# Patient Record
Sex: Female | Born: 1950 | Race: White | Hispanic: Yes | Smoking: Never smoker
Health system: Southern US, Community
[De-identification: ages and names within clinical notes are randomized; demographics above are authoritative.]

## PROBLEM LIST (undated history)

## (undated) DIAGNOSIS — M1990 Unspecified osteoarthritis, unspecified site: Secondary | ICD-10-CM

## (undated) DIAGNOSIS — I8390 Asymptomatic varicose veins of unspecified lower extremity: Secondary | ICD-10-CM

## (undated) DIAGNOSIS — K219 Gastro-esophageal reflux disease without esophagitis: Secondary | ICD-10-CM

## (undated) DIAGNOSIS — E039 Hypothyroidism, unspecified: Secondary | ICD-10-CM

## (undated) DIAGNOSIS — I10 Essential (primary) hypertension: Secondary | ICD-10-CM

## (undated) HISTORY — PX: JOINT REPLACEMENT: SHX530

---

## 2007-04-22 ENCOUNTER — Emergency Department (HOSPITAL_COMMUNITY): Admission: EM | Admit: 2007-04-22 | Discharge: 2007-04-22 | Payer: Self-pay | Admitting: Emergency Medicine

## 2007-05-15 ENCOUNTER — Emergency Department (HOSPITAL_COMMUNITY): Admission: EM | Admit: 2007-05-15 | Discharge: 2007-05-15 | Payer: Self-pay | Admitting: Emergency Medicine

## 2007-06-20 ENCOUNTER — Inpatient Hospital Stay (HOSPITAL_COMMUNITY): Admission: RE | Admit: 2007-06-20 | Discharge: 2007-06-22 | Payer: Self-pay | Admitting: Orthopedic Surgery

## 2007-06-30 HISTORY — PX: HIP SURGERY: SHX245

## 2010-07-15 ENCOUNTER — Emergency Department (HOSPITAL_COMMUNITY): Admission: EM | Admit: 2010-07-15 | Discharge: 2010-07-16 | Payer: Self-pay | Admitting: Emergency Medicine

## 2011-01-11 LAB — URINALYSIS, ROUTINE W REFLEX MICROSCOPIC
Bilirubin Urine: NEGATIVE
Ketones, ur: NEGATIVE mg/dL
Nitrite: NEGATIVE
Urobilinogen, UA: 1 mg/dL (ref 0.0–1.0)

## 2011-01-11 LAB — POCT I-STAT, CHEM 8
BUN: 9 mg/dL (ref 6–23)
Chloride: 105 mEq/L (ref 96–112)
Creatinine, Ser: 0.5 mg/dL (ref 0.4–1.2)
Glucose, Bld: 140 mg/dL — ABNORMAL HIGH (ref 70–99)
Sodium: 141 mEq/L (ref 135–145)
TCO2: 26 mmol/L (ref 0–100)

## 2011-01-11 LAB — CBC
HCT: 42.8 % (ref 36.0–46.0)
Hemoglobin: 14.7 g/dL (ref 12.0–15.0)
MCH: 31.2 pg (ref 26.0–34.0)
MCHC: 34.3 g/dL (ref 30.0–36.0)
MCV: 90.9 fL (ref 78.0–100.0)
RBC: 4.71 MIL/uL (ref 3.87–5.11)

## 2011-01-11 LAB — POCT CARDIAC MARKERS
CKMB, poc: <1 ng/mL — ABNORMAL LOW (ref 1.0–8.0)
Myoglobin, poc: 29.6 ng/mL (ref 12–200)
Myoglobin, poc: 34.3 ng/mL (ref 12–200)

## 2011-01-11 LAB — DIFFERENTIAL
Eosinophils Absolute: 0 10*3/uL (ref 0.0–0.7)
Eosinophils Relative: 0 % (ref 0–5)
Lymphs Abs: 1.3 10*3/uL (ref 0.7–4.0)
Monocytes Absolute: 0.4 10*3/uL (ref 0.1–1.0)
Monocytes Relative: 5 % (ref 3–12)

## 2011-03-13 NOTE — H&P (Signed)
NAME:  Shannon [name]              ACCOUNT NO.:  [id number]   MEDICAL RECORD NO.:  [id number]          PATIENT TYPE:  INP   LOCATION:  NA                           FACILITY:  [facility]   PHYSICIAN:  Dyke Brackett, M.D.    DATE OF BIRTH:  [birth year]   DATE OF ADMISSION:  06/20/2007  DATE OF DISCHARGE:                              HISTORY & PHYSICAL   CHIEF COMPLAINT:  Right hip pain for the last 6 months.   HISTORY OF PRESENT ILLNESS:  This 60 year old Hispanic female presented  to Dr. Madelon Lips with a 13-month history of sudden-onset, progressively  worsening right hip pain.  She has had no known injury or prior surgery  to the hip.  The pain is a constant pulling sensation in the right groin  and trochanteric region with radiation into the knee.  Pain increases  was moving from a sitting to a standing position, then decreases with  lying in bed on her left side.  Percocet provides moderate relief.  The  hip pops, she cannot sleep on the right side at night and the pain keeps  her up at night.  She does have a little bit of back pain, but no  paresthesias.  She has been ambulating with a cane for 6 months.  She  speaks only [place].   ALLERGIES:  NO KNOWN DRUG ALLERGIES.   CURRENT MEDICATIONS:  1. Oxycodone 7.5/325 mg one tablet p.o. q.6 h. p.r.n. for pain.  2. Pepcid 20 mg one tablet p.o. q.a.m.   PAST MEDICAL HISTORY:  Gastroesophageal reflux disease.   PAST SURGICAL HISTORY:  She denies.   SOCIAL HISTORY:  She denies any history of cigarette smoking, alcohol  use or drug use.  She is single and lives with her son and daughter-in-  Social worker.  They live in a 1-story house.  She does have 10 children.  She  currently works for [employer]; she is an Midwife.   FAMILY HISTORY:  Mother died at an known age with unknown medical  problems.  Father died at the age of 6, no major medical problems.  She has 5 brothers and 3 sisters, all alive and well.  She has 6 sons  and 4 daughter  ranging in age from 66 to 71 and they are all alive and  well.   REVIEW OF SYSTEMS:  She does have a remote history of blurred vision.  She does wear glasses.  All other systems are negative and  noncontributory.   PHYSICAL EXAMINATION:  GENERAL:  A well-developed, well-nourished  Hispanic female in no acute distress, talks easily with examiner, walks  with a significantly antalgic gait with the use of a cane.  Height 5  feet 4 inches, weight 250 pounds; BMI is 36.  VITAL SIGNS:  Temperature 98.9 degrees Fahrenheit, pulse 76,  respirations 16 and BP 132/76.  HEENT:  Normocephalic, atraumatic without frontal or maxillary sinus  tenderness to palpation.  Conjunctivae pink.  Sclerae anicteric.  PERRLA.  EOMs intact.  No visible external ear deformities.  Hearing  grossly intact.  Tympanic membranes pearly gray bilaterally with  good  light reflex.  Nose and nasal septum midline.  Nasal mucosa pink and  moist without exudates or polyps noted.  Buccal mucosa pink and moist.  She is missing multiple teeth.  Pharynx without erythema or exudates.  Tongue and uvula midline.  Tongue without fasciculations and uvula rises  equally with phonation.  NECK:  No visible masses or lesions noted.  Trachea midline.  No  palpable lymphadenopathy nor thyromegaly.  Carotids +2 bilaterally  without bruits.  Full range of motion and nontender to palpation along  the cervical spine.  CARDIOVASCULAR:  Heart rate and rhythm regular.  S1 and S2 were present  without rubs, clicks or murmurs noted.  RESPIRATORY:  Respirations even and unlabored.  Breath sounds clear to  auscultation bilaterally without rales or wheezes noted.  ABDOMEN:  Rounded abdominal contour.  Bowel sounds present x4 quadrants.  Soft, nontender to palpation without hepatosplenomegaly nor CVA  tenderness.  Femoral pulses +2 bilaterally.  BACK:  Nontender to palpation along the vertebral column.  BREASTS/GU/RECTAL/PELVIC:  These exams deferred  at this time.  MUSCULOSKELETAL:  No obvious deformities of bilateral upper extremities  with full range of motion of these extremities without pain.  Radial  pulses +2 bilaterally.  Full range of motion of her knees, ankles and  toes bilaterally.  DP and PT pulses +2.  No calf pain with palpation.  Negative Homans sign bilaterally.  Left hip has full extension and  flexion to 100 degrees with full internal and external rotation really  without pain.  There is no pain with palpation about the hip nor with  range of motion.  Right hip:  She is tender to palpation in the right  groin.  She has full extension, but flexion only to 45 degrees and that  causes severe pain.  Any attempts at internal or external rotation cause  severe pain and this is grossly limited.  NEUROLOGIC:  Alert and oriented x3.  Cranial nerves II-XII are grossly  intact.  Strength 5/5 in bilateral upper and lower extremities.  Rapid  alternating movements intact.  Deep tendon reflexes 2+ in bilateral  upper and lower extremities.  Rapid alternating movements intact.  Sensation intact to light touch.   RADIOLOGIC FINDINGS:  X-rays taken of her hip show arthritic changes of  the hip.   IMPRESSION:  1. End-stage osteoarthritis, right hip.  2. Gastroesophageal reflux disease.  3. Hispanic and speaks only [place].   PLAN:  Ms. Shannon [name] will be admitted to [facility] on June 20, 2007 by Dr. Lacretia Nicks. Frederico Hamman.  She will undergo a right hip replacement  by him.  She will undergo all the routine preoperative laboratory tests  and studies prior to this procedure.  If we have any medical issues  while she is hospitalized, we will consult the hospitalists.      Legrand Pitts Duffy, Arnetha Courser, M.D.  Electronically Signed    KED/MEDQ  D:  06/17/2007  T:  06/18/2007  Job:  [id number]

## 2011-03-13 NOTE — Op Note (Signed)
[name]              ACCOUNT NO.:  [id number]   MEDICAL RECORD NO.:  [id number]          PATIENT TYPE:  INP   LOCATION:  5029                         FACILITY:  [facility]   PHYSICIAN:  Dyke Brackett, M.D.    DATE OF BIRTH:  1951/04/12   DATE OF PROCEDURE:  06/20/2007  DATE OF DISCHARGE:                               OPERATIVE REPORT   PREOPERATIVE DIAGNOSIS:  Osteoarthritis right hip.   POSTOPERATIVE DIAGNOSIS:  Osteoarthritis right hip.   OPERATION:  Right total hip replacement (ASR metal-on-metal hip with  13.5 mg AML stem, +2 mm neck length, 46 mm hip ball and 52 mm  acetabulum).   SURGEON:  Dyke Brackett, M.D.   ASSISTANT:  Arlys John D. Petrarca, P.A.-C.   BLOOD LOSS:  Approximately 100.   DESCRIPTION OF PROCEDURE:  Sterile prep and drape in the lateral  position, posterior broach of the hip made taken down iliotibial band  external rotators.  The head was cut about one fingerbreadth above the  lesser trochanter then progressively reamed up to 13 mm to accept a 13.5  mm stem.  The acetabulum was reamed to 51 to accept a 52 mm ASR cup.   Bony surfaces were irrigated.  Trial reduction was carried out with the  femur relative to the acetabulum.  It was elected to use, based on the  patient's young age, an ASR metal-on-metal cup articulation which was  inserted with good stability noted clinically.  Again trial reduction  carried out with the trial femur, +2 mm neck length, equalized leg  lengths.  The femur was inserted followed by the head and neck, again  reduction carried out.  Copious irrigation.  Stability was excellent.  No tendency to dislocate components and all stable.  Closure was  effected with a #1 a Ethibond and 2-0 Vicryl and skin clips.  The  patient was taken to the recovery room in stable condition.      Dyke Brackett, M.D.  Electronically Signed     WDC/MEDQ  D:  06/20/2007  T:  06/21/2007  Job:  [id number]

## 2011-08-10 LAB — URINALYSIS, ROUTINE W REFLEX MICROSCOPIC
Glucose, UA: NEGATIVE
pH: 7.5

## 2011-08-10 LAB — BASIC METABOLIC PANEL
BUN: 15
CO2: 28
Calcium: 8.1 — ABNORMAL LOW
Chloride: 101
Creatinine, Ser: 0.64
GFR calc non Af Amer: >60
Glucose, Bld: 115 — ABNORMAL HIGH
Glucose, Bld: 122 — ABNORMAL HIGH
Potassium: 3.3 — ABNORMAL LOW
Sodium: 136

## 2011-08-10 LAB — COMPREHENSIVE METABOLIC PANEL
AST: 19
Albumin: 3.7
Calcium: 9.5
Chloride: 101
Creatinine, Ser: 0.64
GFR calc Af Amer: >60

## 2011-08-10 LAB — CBC
HCT: 30.5 — ABNORMAL LOW
Hemoglobin: 10.5 — ABNORMAL LOW
Hemoglobin: 10.8 — ABNORMAL LOW
MCHC: 34.6
MCHC: 34.6
MCV: 89.3
Platelets: 225
Platelets: 248
Platelets: 338
RDW: 12.7
RDW: 12.9
WBC: 7.6
WBC: 9.7

## 2011-08-10 LAB — URINE CULTURE

## 2011-08-10 LAB — DIFFERENTIAL
Eosinophils Relative: 0
Lymphocytes Relative: 20
Lymphs Abs: 1.5
Monocytes Absolute: 0.4

## 2011-08-10 LAB — CROSSMATCH: ABO/RH(D): O POS

## 2011-08-10 LAB — ABO/RH: ABO/RH(D): O POS

## 2011-08-10 LAB — URINE MICROSCOPIC-ADD ON

## 2011-12-17 ENCOUNTER — Ambulatory Visit (HOSPITAL_COMMUNITY)
Admission: RE | Admit: 2011-12-17 | Discharge: 2011-12-17 | Disposition: A | Discharge status: Home / Self Care | Payer: Self-pay | Source: Ambulatory Visit | Attending: Internal Medicine | Admitting: Internal Medicine

## 2011-12-17 ENCOUNTER — Other Ambulatory Visit: Payer: Self-pay

## 2011-12-17 DIAGNOSIS — Z01810 Encounter for preprocedural cardiovascular examination: Secondary | ICD-10-CM | POA: Insufficient documentation

## 2011-12-17 DIAGNOSIS — R9431 Abnormal electrocardiogram [ECG] [EKG]: Secondary | ICD-10-CM | POA: Insufficient documentation

## 2011-12-17 DIAGNOSIS — Z01818 Encounter for other preprocedural examination: Secondary | ICD-10-CM

## 2012-02-29 ENCOUNTER — Emergency Department (HOSPITAL_COMMUNITY)
Admission: EM | Admit: 2012-02-29 | Discharge: 2012-02-29 | Disposition: A | Discharge status: Home / Self Care | Payer: Self-pay | Attending: Emergency Medicine | Admitting: Emergency Medicine

## 2012-02-29 ENCOUNTER — Encounter (HOSPITAL_COMMUNITY): Payer: Self-pay | Admitting: Emergency Medicine

## 2012-02-29 DIAGNOSIS — Z711 Person with feared health complaint in whom no diagnosis is made: Secondary | ICD-10-CM | POA: Insufficient documentation

## 2012-02-29 DIAGNOSIS — M79609 Pain in unspecified limb: Secondary | ICD-10-CM | POA: Insufficient documentation

## 2012-02-29 DIAGNOSIS — M549 Dorsalgia, unspecified: Secondary | ICD-10-CM | POA: Insufficient documentation

## 2012-02-29 NOTE — ED Provider Notes (Signed)
History   This chart was scribed for Nelia Shi, MD by Brooks Sailors. The patient was seen in room STRE8/STRE8. Patient's care was started at 1149.   CSN: [id number]  Arrival date & time 02/29/12  1149   First MD Initiated Contact with Patient 02/29/12 1425      Chief Complaint  Patient presents with  . Back Pain  . Leg Pain    HPI Shannon [name] is a 61 y.o. female who presents to the Emergency Department for a follow up appointment. Patient does not speak english and was accompanied by nephew who spoke with physician. Patient was told to come to hospital for pre-surgery work but did not bring paperwork and was brought to ER by Nephew who was unsure where to bring patient. Nephew said that she has relative who could not come today but knows more about patients medical condition and will accompany patient on future visit.    History reviewed. No pertinent past medical history.  History reviewed. No pertinent past surgical history.  History reviewed. No pertinent family history.  History  Substance Use Topics  . Smoking status: Never Smoker   . Smokeless tobacco: Not on file  . Alcohol Use: No    OB History    Grav Para Term Preterm Abortions TAB SAB Ect Mult Living                  Review of Systems  All other systems reviewed and are negative.    Allergies  Review of patient's allergies indicates no known allergies.  Home Medications   Current Outpatient Rx  Name Route Sig Dispense Refill  . OMEGA-3 FATTY ACIDS 1000 MG PO CAPS Oral Take 2 g by mouth daily.    . OXYCODONE HCL 5 MG PO CAPS Oral Take 5 mg by mouth every 4 (four) hours as needed. For pain    . PRESCRIPTION MEDICATION Oral Take 1 tablet by mouth daily. Blood pressure med. Unsure of name. Called the pharmacy pt goes to, they do not even have the pt listed on file      BP 143/56  Pulse 82  Temp(Src) 98.2 F (36.8 C) (Oral)  Resp 20  SpO2 95%  Physical Exam  Nursing note and vitals  reviewed. Constitutional: She is oriented to person, place, and time. She appears well-developed and well-nourished. No distress.  HENT:  Head: Normocephalic and atraumatic.  Eyes: EOM are normal.  Neck: Neck supple. No tracheal deviation present.  Cardiovascular: Normal rate.   Pulmonary/Chest: Effort normal. No respiratory distress.  Musculoskeletal: Normal range of motion.  Neurological: She is alert and oriented to person, place, and time.  Skin: Skin is warm and dry.  Psychiatric: She has a normal mood and affect. Her behavior is normal.    ED Course  Procedures (including critical care time) DIAGNOSTIC STUDIES: Oxygen Saturation is 95% on room air, adequate by my interpretation.    COORDINATION OF CARE: 2:15PM Patient to come back to hospital with relative soon who knows were to take patient for evaluation for future surgery.    Labs Reviewed - No data to display No results found.   1. Person with feared complaint in whom no diagnosis is made       MDM  I personally performed the services described in this documentation, which was scribed in my presence. The recorded information has been reviewed and considered.     Nelia Shi, MD 03/02/12 1136

## 2012-02-29 NOTE — ED Notes (Signed)
Pt c/o left lower back pain into left leg x several weeks; here to get eval; pt speaks little english but family at bedside speaks well

## 2012-03-03 ENCOUNTER — Other Ambulatory Visit (HOSPITAL_COMMUNITY): Payer: Self-pay | Admitting: Internal Medicine

## 2012-03-03 ENCOUNTER — Ambulatory Visit (HOSPITAL_COMMUNITY)
Admission: RE | Admit: 2012-03-03 | Discharge: 2012-03-03 | Disposition: A | Discharge status: Home / Self Care | Payer: Self-pay | Source: Ambulatory Visit | Attending: Internal Medicine | Admitting: Internal Medicine

## 2012-03-03 DIAGNOSIS — E049 Nontoxic goiter, unspecified: Secondary | ICD-10-CM | POA: Insufficient documentation

## 2012-03-03 DIAGNOSIS — I517 Cardiomegaly: Secondary | ICD-10-CM | POA: Insufficient documentation

## 2012-03-03 DIAGNOSIS — R0602 Shortness of breath: Secondary | ICD-10-CM | POA: Insufficient documentation

## 2012-03-03 DIAGNOSIS — Z01818 Encounter for other preprocedural examination: Secondary | ICD-10-CM

## 2012-09-01 ENCOUNTER — Other Ambulatory Visit: Payer: Self-pay

## 2012-09-01 ENCOUNTER — Emergency Department (HOSPITAL_COMMUNITY): Payer: Self-pay

## 2012-09-01 ENCOUNTER — Encounter (HOSPITAL_COMMUNITY): Payer: Self-pay | Admitting: *Deleted

## 2012-09-01 ENCOUNTER — Inpatient Hospital Stay (HOSPITAL_COMMUNITY)
Admission: EM | Admit: 2012-09-01 | Discharge: 2012-09-07 | DRG: 603 | Disposition: A | Discharge status: Home / Self Care | Payer: Self-pay | Attending: Internal Medicine | Admitting: Internal Medicine

## 2012-09-01 DIAGNOSIS — R600 Localized edema: Secondary | ICD-10-CM

## 2012-09-01 DIAGNOSIS — E876 Hypokalemia: Secondary | ICD-10-CM | POA: Diagnosis present

## 2012-09-01 DIAGNOSIS — I8390 Asymptomatic varicose veins of unspecified lower extremity: Secondary | ICD-10-CM | POA: Diagnosis present

## 2012-09-01 DIAGNOSIS — E049 Nontoxic goiter, unspecified: Secondary | ICD-10-CM | POA: Diagnosis present

## 2012-09-01 DIAGNOSIS — M1990 Unspecified osteoarthritis, unspecified site: Secondary | ICD-10-CM | POA: Diagnosis present

## 2012-09-01 DIAGNOSIS — L03119 Cellulitis of unspecified part of limb: Principal | ICD-10-CM

## 2012-09-01 DIAGNOSIS — E010 Iodine-deficiency related diffuse (endemic) goiter: Secondary | ICD-10-CM | POA: Diagnosis present

## 2012-09-01 DIAGNOSIS — K2980 Duodenitis without bleeding: Secondary | ICD-10-CM | POA: Diagnosis present

## 2012-09-01 DIAGNOSIS — Z6841 Body Mass Index (BMI) 40.0 and over, adult: Secondary | ICD-10-CM

## 2012-09-01 DIAGNOSIS — K219 Gastro-esophageal reflux disease without esophagitis: Secondary | ICD-10-CM | POA: Diagnosis present

## 2012-09-01 DIAGNOSIS — R609 Edema, unspecified: Secondary | ICD-10-CM | POA: Diagnosis present

## 2012-09-01 DIAGNOSIS — R140 Abdominal distension (gaseous): Secondary | ICD-10-CM | POA: Diagnosis present

## 2012-09-01 DIAGNOSIS — L02419 Cutaneous abscess of limb, unspecified: Principal | ICD-10-CM | POA: Diagnosis present

## 2012-09-01 DIAGNOSIS — I872 Venous insufficiency (chronic) (peripheral): Secondary | ICD-10-CM | POA: Diagnosis present

## 2012-09-01 DIAGNOSIS — I10 Essential (primary) hypertension: Secondary | ICD-10-CM | POA: Diagnosis present

## 2012-09-01 DIAGNOSIS — R7989 Other specified abnormal findings of blood chemistry: Secondary | ICD-10-CM

## 2012-09-01 HISTORY — DX: Unspecified osteoarthritis, unspecified site: M19.90

## 2012-09-01 HISTORY — DX: Asymptomatic varicose veins of unspecified lower extremity: I83.90

## 2012-09-01 HISTORY — DX: Gastro-esophageal reflux disease without esophagitis: K21.9

## 2012-09-01 HISTORY — DX: Hypothyroidism, unspecified: E03.9

## 2012-09-01 HISTORY — DX: Essential (primary) hypertension: I10

## 2012-09-01 LAB — CBC WITH DIFFERENTIAL/PLATELET
HCT: 40.2 % (ref 36.0–46.0)
Hemoglobin: 13.2 g/dL (ref 12.0–15.0)
Lymphs Abs: 1.6 10*3/uL (ref 0.7–4.0)
MCH: 29.9 pg (ref 26.0–34.0)
Monocytes Absolute: 0.7 10*3/uL (ref 0.1–1.0)
Monocytes Relative: 11 % (ref 3–12)
Neutro Abs: 3.9 10*3/uL (ref 1.7–7.7)
Neutrophils Relative %: 61 % (ref 43–77)
RBC: 4.41 MIL/uL (ref 3.87–5.11)

## 2012-09-01 NOTE — ED Notes (Signed)
Pt reports bilat leg pain, swelling and redness x2 days - pt w/ bilat +3 pitting edema to bilat LE, varicose veins noted bilat, and erythema. Pt admits to some shortness of breath and back pain as well. Lung sounds clear and equal bilat. Pt admits to fever x2 days ago, however family states pt takes ibuprofen daily. Pt A&Ox4, in no acute distress.

## 2012-09-01 NOTE — ED Notes (Signed)
Pt in restroom at this time.

## 2012-09-02 ENCOUNTER — Emergency Department (HOSPITAL_COMMUNITY): Payer: Self-pay

## 2012-09-02 ENCOUNTER — Encounter (HOSPITAL_COMMUNITY): Payer: Self-pay | Admitting: Internal Medicine

## 2012-09-02 DIAGNOSIS — R609 Edema, unspecified: Secondary | ICD-10-CM

## 2012-09-02 DIAGNOSIS — M7989 Other specified soft tissue disorders: Secondary | ICD-10-CM

## 2012-09-02 DIAGNOSIS — R7989 Other specified abnormal findings of blood chemistry: Secondary | ICD-10-CM

## 2012-09-02 DIAGNOSIS — R791 Abnormal coagulation profile: Secondary | ICD-10-CM

## 2012-09-02 DIAGNOSIS — I10 Essential (primary) hypertension: Secondary | ICD-10-CM | POA: Diagnosis present

## 2012-09-02 DIAGNOSIS — K219 Gastro-esophageal reflux disease without esophagitis: Secondary | ICD-10-CM | POA: Diagnosis present

## 2012-09-02 DIAGNOSIS — M1990 Unspecified osteoarthritis, unspecified site: Secondary | ICD-10-CM | POA: Diagnosis present

## 2012-09-02 DIAGNOSIS — R600 Localized edema: Secondary | ICD-10-CM

## 2012-09-02 DIAGNOSIS — L03119 Cellulitis of unspecified part of limb: Secondary | ICD-10-CM

## 2012-09-02 DIAGNOSIS — I8390 Asymptomatic varicose veins of unspecified lower extremity: Secondary | ICD-10-CM | POA: Diagnosis present

## 2012-09-02 LAB — COMPREHENSIVE METABOLIC PANEL
Alkaline Phosphatase: 53 U/L (ref 39–117)
BUN: 9 mg/dL (ref 6–23)
Chloride: 98 mEq/L (ref 96–112)
Creatinine, Ser: 0.54 mg/dL (ref 0.50–1.10)
GFR calc Af Amer: >90 mL/min (ref >90)
GFR calc non Af Amer: >90 mL/min (ref >90)
Glucose, Bld: 103 mg/dL — ABNORMAL HIGH (ref 70–99)
Potassium: 3.2 mEq/L — ABNORMAL LOW (ref 3.5–5.1)
Total Bilirubin: 0.3 mg/dL (ref 0.3–1.2)

## 2012-09-02 LAB — PRO B NATRIURETIC PEPTIDE: Pro B Natriuretic peptide (BNP): 49.8 pg/mL (ref 0–125)

## 2012-09-02 MED ORDER — ACETAMINOPHEN 325 MG PO TABS
650.0000 mg | ORAL_TABLET | Freq: Four times a day (QID) | ORAL | Status: DC | PRN
  Administered 2012-09-03 – 2012-09-04 (×2): 650 mg via ORAL
  Filled 2012-09-02 (×2): qty 2

## 2012-09-02 MED ORDER — PANTOPRAZOLE SODIUM 40 MG PO TBEC
40.0000 mg | DELAYED_RELEASE_TABLET | Freq: Every day | ORAL | Status: DC
  Administered 2012-09-02 – 2012-09-04 (×3): 40 mg via ORAL
  Filled 2012-09-02 (×3): qty 1

## 2012-09-02 MED ORDER — NAPROXEN 500 MG PO TABS
500.0000 mg | ORAL_TABLET | Freq: Two times a day (BID) | ORAL | Status: DC
  Administered 2012-09-02 – 2012-09-05 (×5): 500 mg via ORAL
  Filled 2012-09-02 (×9): qty 1

## 2012-09-02 MED ORDER — METOPROLOL TARTRATE 50 MG PO TABS
50.0000 mg | ORAL_TABLET | Freq: Two times a day (BID) | ORAL | Status: DC
  Administered 2012-09-02 – 2012-09-06 (×7): 50 mg via ORAL
  Filled 2012-09-02 (×10): qty 1

## 2012-09-02 MED ORDER — OXYCODONE HCL 5 MG PO TABS
5.0000 mg | ORAL_TABLET | ORAL | Status: DC | PRN
  Administered 2012-09-02 – 2012-09-07 (×11): 5 mg via ORAL
  Filled 2012-09-02 (×10): qty 1

## 2012-09-02 MED ORDER — ENOXAPARIN SODIUM 60 MG/0.6ML ~~LOC~~ SOLN
60.0000 mg | SUBCUTANEOUS | Status: DC
  Administered 2012-09-03 – 2012-09-07 (×5): 60 mg via SUBCUTANEOUS
  Filled 2012-09-02 (×6): qty 0.6

## 2012-09-02 MED ORDER — ONDANSETRON HCL 4 MG/2ML IJ SOLN
4.0000 mg | Freq: Four times a day (QID) | INTRAMUSCULAR | Status: DC | PRN
  Administered 2012-09-05 – 2012-09-07 (×3): 4 mg via INTRAVENOUS
  Filled 2012-09-02 (×2): qty 2

## 2012-09-02 MED ORDER — ONDANSETRON HCL 4 MG PO TABS: 4.0000 mg | ORAL_TABLET | Freq: Four times a day (QID) | ORAL | Status: DC | PRN

## 2012-09-02 MED ORDER — ENOXAPARIN SODIUM 120 MG/0.8ML ~~LOC~~ SOLN
120.0000 mg | Freq: Once | SUBCUTANEOUS | Status: AC
  Administered 2012-09-02: 120 mg via SUBCUTANEOUS
  Filled 2012-09-02: qty 0.8

## 2012-09-02 MED ORDER — DIPHENHYDRAMINE HCL 25 MG PO CAPS
25.0000 mg | ORAL_CAPSULE | Freq: Four times a day (QID) | ORAL | Status: DC | PRN
  Administered 2012-09-02: 25 mg via ORAL
  Filled 2012-09-02: qty 1

## 2012-09-02 MED ORDER — ACETAMINOPHEN 650 MG RE SUPP: 650.0000 mg | Freq: Four times a day (QID) | RECTAL | Status: DC | PRN

## 2012-09-02 MED ORDER — CLINDAMYCIN PHOSPHATE 900 MG/50ML IV SOLN
900.0000 mg | Freq: Three times a day (TID) | INTRAVENOUS | Status: DC
  Administered 2012-09-02 – 2012-09-06 (×12): 900 mg via INTRAVENOUS
  Filled 2012-09-02 (×14): qty 50

## 2012-09-02 MED ORDER — AMLODIPINE BESYLATE 10 MG PO TABS
10.0000 mg | ORAL_TABLET | Freq: Every day | ORAL | Status: DC
  Administered 2012-09-02 – 2012-09-03 (×2): 10 mg via ORAL
  Filled 2012-09-02 (×2): qty 1

## 2012-09-02 MED ORDER — IOHEXOL 350 MG/ML SOLN
100.0000 mL | Freq: Once | INTRAVENOUS | Status: AC | PRN
  Administered 2012-09-02: 100 mL via INTRAVENOUS

## 2012-09-02 MED ORDER — ENOXAPARIN SODIUM 40 MG/0.4ML ~~LOC~~ SOLN
40.0000 mg | SUBCUTANEOUS | Status: DC
  Filled 2012-09-02: qty 0.4

## 2012-09-02 MED ORDER — CLINDAMYCIN PHOSPHATE 900 MG/50ML IV SOLN
900.0000 mg | Freq: Once | INTRAVENOUS | Status: AC
  Administered 2012-09-02: 900 mg via INTRAVENOUS
  Filled 2012-09-02: qty 50

## 2012-09-02 MED ORDER — SENNOSIDES-DOCUSATE SODIUM 8.6-50 MG PO TABS
1.0000 | ORAL_TABLET | Freq: Every evening | ORAL | Status: DC | PRN
  Administered 2012-09-03: 1 via ORAL
  Filled 2012-09-02: qty 1

## 2012-09-02 NOTE — ED Notes (Addendum)
Family at bedside. Pt speaks minimal english, niece at bedside interperets

## 2012-09-02 NOTE — H&P (Signed)
[facility] History and Physical  Shannon [name] YNW:[id number] DOB: 1951/10/22 DOA: 09/01/2012  Referring physician: Azalia Bilis PCP: [name], MD   Chief Complaint: Lower extremity redness and swelling  HPI:   History obtained via phone interpreter from patient and her daughter.   The patient is a 61 year old female with history of hypothyroidism per chart but not per patient history, hypertension, GERD, osteoarthritis and varicose veins who presented with a 2 day history of bilateral lower extremity pain and redness.  She also had a fever two days ago to 100.70F.  She states that her legs have been gradually becoming more swollen, but the pain and redness only started recently.  She denies DOE, orthopnea, PND.  She denies urinary symptoms as below, however, her urine is dark in the urine specimen cup.  She has had nausea for 2-3 days.    Review of Systems:  Patient denies chill, changes in hearing and vision, sore throat, rhinorrhea, cough, chest pain, shortness of breath, vomiting, diarrhea, abdominal pain, dysuria, hematuria, blood in stools, lymphadenopathy, abnormal bruising or bleeding, joint swelling or pains that are different from prior, focal numbness or weakness, polyuria, polydipsia.    Past Medical History  Diagnosis Date  . Hypothyroidism   . Hypertension   . Varicose vein of leg   . GERD (gastroesophageal reflux disease)   . Osteoarthritis    Past Surgical History  Procedure Date  . Hip surgery 2008-9    right   Social History:  reports that she has never smoked. She does not have any smokeless tobacco history on file. She reports that she does not drink alcohol or use illicit drugs. Ambulates with walker at home, but has had increased difficulty for the last few days.    No Known Allergies  Family History  Problem Relation Age of Onset  . High blood pressure    Denies family history of lupus.     Prior to Admission medications     Medication Sig Start Date End Date Taking? Authorizing Provider  amLODipine (NORVASC) 10 MG tablet Take 10 mg by mouth daily.   Yes Historical Provider, MD  diphenhydrAMINE (BENADRYL) 25 mg capsule Take 25 mg by mouth every 6 (six) hours as needed. Itching   Yes Historical Provider, MD  metoprolol (LOPRESSOR) 50 MG tablet Take 50 mg by mouth 2 (two) times daily.   Yes Historical Provider, MD  naproxen (NAPROSYN) 500 MG tablet Take 500 mg by mouth 2 (two) times daily with a meal.   Yes Historical Provider, MD  omeprazole (PRILOSEC) 20 MG capsule Take 20 mg by mouth daily.   Yes Historical Provider, MD  oxycodone (OXY-IR) 5 MG capsule Take 5 mg by mouth every 4 (four) hours as needed. For pain   Yes Historical Provider, MD   Physical Exam: Filed Vitals:   09/01/12 2307 09/02/12 0051 09/02/12 0320 09/02/12 0733  BP: 138/62 154/85  127/71  Pulse: 90 82  95  Temp: 98.2 F (36.8 C) 98.9 F (37.2 C)  98.4 F (36.9 C)  TempSrc: Oral Oral  Oral  Resp: 20   23  Height:   5\' 4"  (1.626 m)   Weight:   123.424 kg (272 lb 1.6 oz)   SpO2: 95% 97%  94%     General:   Hispanic obese female, no acute distress, lying on stretcher, pleasant and conversant  Eyes:  PERRL, anicteric, noninjected  ENT: MMM, no erythema or exudates or plaques  Neck: supple, my thyromegaly.  End-expiratory  wheeze from neck area.  No stridor  Cardiovascular: RRR, no m/r/g.    Respiratory: CTAB  Abdomen: NABS, soft, nondistended, obese, nontender  Skin:  Erythema of the skin around the neck and bilateral lower extremities that is mildly tender to palpation.  Large varicose veins bilaterally.  Erythema extends from 2-3" below the knees bilaterally to the ankle area without clear demarkation.    Musculoskeletal: 2+ pitting edema of lower extremities   Psychiatric: A&Ox4  Neurologic: III-XII intact, strength 5/5 throughout, sensation intact to light touch.    Labs on Admission:  Basic Metabolic Panel:  Lab  09/01/12 2330  NA 136  K 3.2*  CL 98  CO2 27  GLUCOSE 103*  BUN 9  CREATININE 0.54  CALCIUM 8.9  MG --  PHOS --   Liver Function Tests:  Lab 09/01/12 2330  AST 37  ALT 41*  ALKPHOS 53  BILITOT 0.3  PROT 6.3  ALBUMIN 3.3*   No results found for this basename: LIPASE:5,AMYLASE:5 in the last 168 hours No results found for this basename: AMMONIA:5 in the last 168 hours CBC:  Lab 09/01/12 2330  WBC 6.5  NEUTROABS 3.9  HGB 13.2  HCT 40.2  MCV 91.2  PLT 296   Cardiac Enzymes:  Lab 09/02/12 0248  CKTOTAL --  CKMB --  CKMBINDEX --  TROPONINI <0.30    BNP (last 3 results)  Basename 09/01/12 2330  PROBNP 49.8   CBG: No results found for this basename: GLUCAP:5 in the last 168 hours  Radiological Exams on Admission: Dg Chest 2 View  09/01/2012  *RADIOLOGY REPORT*  Clinical Data: Shortness of breath, wheezing and leg swelling.  CHEST - 2 VIEW  Comparison: Chest radiograph performed 03/03/2012  Findings: The lungs are well-aerated.  Vascular congestion is noted, with increased interstitial markings, compatible with pulmonary edema.  Small bilateral pleural effusions are seen, with associated lateral pleural thickening.  There is no evidence of pneumothorax.  The heart is mildly enlarged.  The patient's known chronic thyromegaly is grossly unchanged in appearance; there is associated narrowing of the subglottic trachea.  No acute osseous abnormalities are seen.  IMPRESSION:  1.  Vascular congestion, with increased interstitial markings and small bilateral pleural effusions, compatible with pulmonary edema. Mild cardiomegaly noted. 2.  Known chronic thyromegaly, grossly unchanged in appearance. Associated narrowing of the subglottic trachea.   Original Report Authenticated By: Tonia Ghent, M.D.    Ct Angio Chest Pe W/cm &/or Wo Cm  09/02/2012  *RADIOLOGY REPORT*  Clinical Data: Shortness of breath; bilateral lower extremity pain and swelling.  Elevated D-dimer.  Back pain.   CT ANGIOGRAPHY CHEST  Technique:  Multidetector CT imaging of the chest using the standard protocol during bolus administration of intravenous contrast. Multiplanar reconstructed images including MIPs were obtained and reviewed to evaluate the vascular anatomy.  Contrast: OMNIPAQUE IOHEXOL 350 MG/ML SOLN  Comparison: Chest radiograph performed 09/01/2012, and CTA of the chest performed 07/16/2010  Findings: There is no evidence of pulmonary embolus.  Mild diffuse interstitial prominence is noted, likely reflecting mild interstitial edema given the appearance on the prior chest radiograph.  No definite pleural effusion or pneumothorax is seen. No masses are identified; no abnormal focal contrast enhancement is seen.  The mediastinum is grossly unremarkable in appearance.  There is no evidence of mediastinal lymphadenopathy.  No pericardial effusion is identified.  The great vessels are unremarkable in appearance. Incidental note is again made of an enlarged thyroid gland, with narrowing of the subglottic  trachea.  No axillary lymphadenopathy is seen.  The visualized portions of the thyroid gland are unremarkable in appearance.  The visualized portions of the liver and spleen are unremarkable. A small to moderate hiatal hernia is noted.  No acute osseous abnormalities are seen.  IMPRESSION:  1.  No evidence of pulmonary embolus. 2.  Mild diffuse interstitial prominence noted, likely reflecting mild interstitial edema given the appearance on the prior chest radiograph. 3.  Enlarged thyroid gland, with associated narrowing of the subglottic trachea; this is grossly stable from 2011. 4.  Small to moderate hiatal hernia seen.   Original Report Authenticated By: Tonia Ghent, M.D.     EKG: Independently reviewed. NSR, inverted T-waves in V4-V6  Assessment/Plan Principal Problem:  *Cellulitis of leg Active Problems:  Hypertension  Varicose vein of leg  GERD (gastroesophageal reflux disease)   Osteoarthritis  Bilateral lower extremity edema  Elevated d-dimer  Lower extremity erythema:  Differential includes bilateral DVT, cellulitis, vasculitis, vitamin deficiency.   Most likely patient has venous stasis with some cellulitis.  Vasculitis less likely with normal ESR.   -  Bilateral lower extremity duplex -  Clindamycin  -  Monitor clinically for improvement.   -  Pain control with tylenol and oxycodone (home med)  Lower extremity edema:  DDx includes CHF, venous insufficiency, nephrotic syndrome, malabsorption.  Creatinine is normal so CKD less likely.   Albumin is mildly depressed  -  ECHO -  UA  -  Duplex as above  Enlarged thyroid, stable.   -  TSH -  ENT consult as outpatient  HTN, blood pressure normal.  Continue home medications GERD, stable.  Continue PPI OA, stable.  Continue oxycodone. Rolling walker to bedside  DIET: Healthy heart ACCESS: PIV IVF:  None PROPH:  lovenox   Code Status: Full code Family Communication: Spoke with patient and daughter, claudia barrientos who was present at bedside.  Debarah Crape is primary contact at [phone].  Used spanish interpreter Disposition Plan: Pending improvement in lower extremity erythema  Time spent: 45  Shaquala Broeker [facility] Pager [phone]  If 7PM-7AM, please contact night-coverage [email or web address] Password [email or web address] 09/02/2012, 8:08 AM

## 2012-09-02 NOTE — ED Notes (Signed)
Care of pt assumed. Daughter and Hospitalist MD at bedside. Using interpreter phone for assessment. Pt in NAD. Will continue to monitor. Awaiting bed assignment.

## 2012-09-02 NOTE — Progress Notes (Signed)
*  PRELIMINARY RESULTS* Vascular Ultrasound Lower extremity venous duplex has been completed.  Preliminary findings: Bilateral:  No evidence of DVT, superficial thrombosis, or Baker's Cyst.    Farrel Demark, RDMS, RVT 09/02/2012, 9:32 AM

## 2012-09-02 NOTE — ED Provider Notes (Signed)
History     CSN: [id number]  Arrival date & time 09/01/12  2132   First MD Initiated Contact with Patient 09/02/12 0108      Chief Complaint  Patient presents with  . Leg Swelling    (Consider location/radiation/quality/duration/timing/severity/associated sxs/prior treatment) HPI History provided by pt and her niece who is interpreting.  Pt c/o 2 weeks of bilateral lower leg pain.  Pain worst lateral aspect left lower leg.  2 days ago, pain became associated w/ edema and erythema of lower legs, as well as fever, max temp 103.  Unable to bear weight d/t severity.  Has never had these sx in the past.  Has chronic low back as well as chronic L hip pain.  Patient thinks the pain may be related.  She has also been short of breath since this evening and has diffuse upper back pain.  No h/o CHF.   Past Medical History  Diagnosis Date  . Hypothyroidism   . Hypertension   . Varicose vein of leg   . GERD (gastroesophageal reflux disease)     Past Surgical History  Procedure Date  . Hip surgery     History reviewed. No pertinent family history.  History  Substance Use Topics  . Smoking status: Never Smoker   . Smokeless tobacco: Not on file  . Alcohol Use: No    OB History    Grav Para Term Preterm Abortions TAB SAB Ect Mult Living                  Review of Systems  All other systems reviewed and are negative.    Allergies  Review of patient's allergies indicates no known allergies.  Home Medications   Current Outpatient Rx  Name  Route  Sig  Dispense  Refill  . AMLODIPINE BESYLATE 10 MG PO TABS   Oral   Take 10 mg by mouth daily.         Marland Kitchen DIPHENHYDRAMINE HCL 25 MG PO CAPS   Oral   Take 25 mg by mouth every 6 (six) hours as needed. Itching         . METOPROLOL TARTRATE 50 MG PO TABS   Oral   Take 50 mg by mouth 2 (two) times daily.         Marland Kitchen NAPROXEN 500 MG PO TABS   Oral   Take 500 mg by mouth 2 (two) times daily with a meal.         .  OMEPRAZOLE 20 MG PO CPDR   Oral   Take 20 mg by mouth daily.         . OXYCODONE HCL 5 MG PO CAPS   Oral   Take 5 mg by mouth every 4 (four) hours as needed. For pain           BP 154/85  Pulse 82  Temp 98.9 F (37.2 C) (Oral)  Resp 20  SpO2 97%  Physical Exam  Nursing note and vitals reviewed. Constitutional: She is oriented to person, place, and time. She appears well-developed and well-nourished. No distress.  HENT:  Head: Normocephalic and atraumatic.  Eyes:       Normal appearance  Neck: Normal range of motion.  Cardiovascular: Normal rate and regular rhythm.   Pulmonary/Chest: Effort normal and breath sounds normal. No respiratory distress.  Musculoskeletal: Normal range of motion.       2+ pittting edema bilateral lower legs to level of knee.  Varicose veins. Circumferential,  non-blanching erythema.  Severe tenderness lateral aspect left lower leg. Tenderness lateral and posterior L hip as well. 2+ DP pulse and distal sensation intact.    Neurological: She is alert and oriented to person, place, and time.  Skin: Skin is warm and dry. No rash noted.  Psychiatric: She has a normal mood and affect. Her behavior is normal.    ED Course  Procedures (including critical care time)  Labs Reviewed  COMPREHENSIVE METABOLIC PANEL - Abnormal; Notable for the following:    Potassium 3.2 (*)     Glucose, Bld 103 (*)     Albumin 3.3 (*)     ALT 41 (*)     All other components within normal limits  CBC WITH DIFFERENTIAL  PRO B NATRIURETIC PEPTIDE   Dg Chest 2 View  09/01/2012  *RADIOLOGY REPORT*  Clinical Data: Shortness of breath, wheezing and leg swelling.  CHEST - 2 VIEW  Comparison: Chest radiograph performed 03/03/2012  Findings: The lungs are well-aerated.  Vascular congestion is noted, with increased interstitial markings, compatible with pulmonary edema.  Small bilateral pleural effusions are seen, with associated lateral pleural thickening.  There is no evidence of  pneumothorax.  The heart is mildly enlarged.  The patient's known chronic thyromegaly is grossly unchanged in appearance; there is associated narrowing of the subglottic trachea.  No acute osseous abnormalities are seen.  IMPRESSION:  1.  Vascular congestion, with increased interstitial markings and small bilateral pleural effusions, compatible with pulmonary edema. Mild cardiomegaly noted. 2.  Known chronic thyromegaly, grossly unchanged in appearance. Associated narrowing of the subglottic trachea.   Original Report Authenticated By: Tonia Ghent, M.D.      1. Cellulitis of lower leg       MDM  61yo F w/ h/o HTN, otherwise healthy, presents w/ c/o bilateral LE pain/edema/erythema and fever x 2d.  Has had SOB and diffuse upper back pain since this evening.  VS w/in nml range, lungs clear, symmetric lower leg pitting edema, circumferential erythema and tenderness at left lateral lower leg, NV intact on exam.  CXR shows vascular congestion, small bil pleural effusion and mild cardiomegaly.  Labs sig for elevated d-dimer, nml BNP and mild hypokalemia.  CTA chest ordered and pending.  Pt declines pain medication at this time.  Dr. Patria Mane to dispo.         Otilio Miu, [place] 09/02/12 [phone]

## 2012-09-02 NOTE — ED Provider Notes (Signed)
Medical screening examination/treatment/procedure(s) were conducted as a shared visit with non-physician practitioner(s) and myself.  I personally evaluated the patient during the encounter  Patient be admitted for additional workup including IV antibiotics at this time.  Please see my other note for details  Lyanne Co, MD 09/02/12 [phone]

## 2012-09-02 NOTE — ED Provider Notes (Signed)
6:59 AM Medical screening examination/treatment/procedure(s) were conducted as a shared visit with non-physician practitioner(s) and myself.  I personally evaluated the patient during the encounter  This is likely either a cellulitis versus a vasculitis of her bilateral lower extremities.  Given infection fever vitamin D3 at home this is concerning for cellulitis.  IV clindamycin now.  Elevated d-dimer.  Ultrasound pending of her lower extremities at this time.  Lovenox given earlier in the patient's care.  CT angiogram chest negative for pulmonary embolism.  Sedimentation rate normal.  The patient be admitted the hospital for additional workup   Date: 09/02/2012  Rate: 82  Rhythm: normal sinus rhythm  QRS Axis: normal  Intervals: normal  ST/T Wave abnormalities: Nonspecific T wave changes  Conduction Disutrbances: none  Narrative Interpretation:   Old EKG Reviewed: No significant changes noted     The encounter diagnosis was Cellulitis of lower leg.  Dg Chest 2 View  09/01/2012  *RADIOLOGY REPORT*  Clinical Data: Shortness of breath, wheezing and leg swelling.  CHEST - 2 VIEW  Comparison: Chest radiograph performed 03/03/2012  Findings: The lungs are well-aerated.  Vascular congestion is noted, with increased interstitial markings, compatible with pulmonary edema.  Small bilateral pleural effusions are seen, with associated lateral pleural thickening.  There is no evidence of pneumothorax.  The heart is mildly enlarged.  The patient's known chronic thyromegaly is grossly unchanged in appearance; there is associated narrowing of the subglottic trachea.  No acute osseous abnormalities are seen.  IMPRESSION:  1.  Vascular congestion, with increased interstitial markings and small bilateral pleural effusions, compatible with pulmonary edema. Mild cardiomegaly noted. 2.  Known chronic thyromegaly, grossly unchanged in appearance. Associated narrowing of the subglottic trachea.   Original Report  Authenticated By: Tonia Ghent, M.D.    Ct Angio Chest Pe W/cm &/or Wo Cm  09/02/2012  *RADIOLOGY REPORT*  Clinical Data: Shortness of breath; bilateral lower extremity pain and swelling.  Elevated D-dimer.  Back pain.  CT ANGIOGRAPHY CHEST  Technique:  Multidetector CT imaging of the chest using the standard protocol during bolus administration of intravenous contrast. Multiplanar reconstructed images including MIPs were obtained and reviewed to evaluate the vascular anatomy.  Contrast: OMNIPAQUE IOHEXOL 350 MG/ML SOLN  Comparison: Chest radiograph performed 09/01/2012, and CTA of the chest performed 07/16/2010  Findings: There is no evidence of pulmonary embolus.  Mild diffuse interstitial prominence is noted, likely reflecting mild interstitial edema given the appearance on the prior chest radiograph.  No definite pleural effusion or pneumothorax is seen. No masses are identified; no abnormal focal contrast enhancement is seen.  The mediastinum is grossly unremarkable in appearance.  There is no evidence of mediastinal lymphadenopathy.  No pericardial effusion is identified.  The great vessels are unremarkable in appearance. Incidental note is again made of an enlarged thyroid gland, with narrowing of the subglottic trachea.  No axillary lymphadenopathy is seen.  The visualized portions of the thyroid gland are unremarkable in appearance.  The visualized portions of the liver and spleen are unremarkable. A small to moderate hiatal hernia is noted.  No acute osseous abnormalities are seen.  IMPRESSION:  1.  No evidence of pulmonary embolus. 2.  Mild diffuse interstitial prominence noted, likely reflecting mild interstitial edema given the appearance on the prior chest radiograph. 3.  Enlarged thyroid gland, with associated narrowing of the subglottic trachea; this is grossly stable from 2011. 4.  Small to moderate hiatal hernia seen.   Original Report Authenticated By: Tonia Ghent, M.D.  I  personally reviewed the imaging tests through PACS system  I reviewed available ER/hospitalization records through the EMR   Lyanne Co, MD 09/02/12 [phone]

## 2012-09-02 NOTE — ED Notes (Signed)
Pt is complaining of lower back pain and bil lower ext swelling and redness.  Patient also complains of SOB with minimal exertion.  SOB started approximately two weeks ago.

## 2012-09-02 NOTE — ED Notes (Signed)
Report given to floor. RN is also charging and has to go to bed meeting. Requests pt be transported after this. Sec and charge RN aware.

## 2012-09-02 NOTE — Progress Notes (Signed)
  Echocardiogram 2D Echocardiogram has been performed.  Shannon [name] 09/02/2012, 1:24 PM

## 2012-09-02 NOTE — ED Notes (Addendum)
Pt care assumed.  Pt was sitting in WC.  Pt assisted back to the stretcher for venous doppler to be done.  Pt reports pain only with wt bearing.

## 2012-09-03 DIAGNOSIS — I10 Essential (primary) hypertension: Secondary | ICD-10-CM

## 2012-09-03 LAB — BASIC METABOLIC PANEL
CO2: 28 mEq/L (ref 19–32)
Calcium: 8.8 mg/dL (ref 8.4–10.5)
Creatinine, Ser: 0.57 mg/dL (ref 0.50–1.10)
Glucose, Bld: 101 mg/dL — ABNORMAL HIGH (ref 70–99)
Sodium: 136 mEq/L (ref 135–145)

## 2012-09-03 LAB — URINALYSIS, ROUTINE W REFLEX MICROSCOPIC
Bilirubin Urine: NEGATIVE
Glucose, UA: NEGATIVE mg/dL
Hgb urine dipstick: NEGATIVE
Ketones, ur: NEGATIVE mg/dL
Protein, ur: NEGATIVE mg/dL

## 2012-09-03 LAB — CBC
MCH: 29.6 pg (ref 26.0–34.0)
MCV: 90.7 fL (ref 78.0–100.0)
Platelets: 321 10*3/uL (ref 150–400)
RBC: 4.32 MIL/uL (ref 3.87–5.11)

## 2012-09-03 LAB — HEMOGLOBIN A1C: Hgb A1c MFr Bld: 6 % — ABNORMAL HIGH (ref <5.7)

## 2012-09-03 MED ORDER — POTASSIUM CHLORIDE CRYS ER 20 MEQ PO TBCR
40.0000 meq | EXTENDED_RELEASE_TABLET | Freq: Once | ORAL | Status: AC
  Administered 2012-09-03: 40 meq via ORAL
  Filled 2012-09-03: qty 2

## 2012-09-03 MED ORDER — FUROSEMIDE 10 MG/ML IJ SOLN
40.0000 mg | Freq: Once | INTRAMUSCULAR | Status: AC
  Administered 2012-09-03: 40 mg via INTRAVENOUS
  Filled 2012-09-03: qty 4

## 2012-09-03 MED ORDER — AMLODIPINE BESYLATE 5 MG PO TABS
5.0000 mg | ORAL_TABLET | Freq: Every day | ORAL | Status: DC
  Administered 2012-09-04: 5 mg via ORAL
  Filled 2012-09-03: qty 1

## 2012-09-03 NOTE — Progress Notes (Signed)
[facility] PROGRESS NOTE  Shannon [name] ZOX:[id number] DOB: 01/22/1951 DOA: 09/01/2012 PCP: [name], MD  Assessment/Plan: Bilateral lower extremity edema/erythema  Elevated d-dimer. Lower extremity erythema: Ultrasound negative for  DVT. Most likely patient has venous stasis with some cellulitis.  -Continue IV clindamycin. For the edema we'll give one dose of Lasix IV - Pain control with tylenol and oxycodone (home med)    Enlarged thyroid, stable. Followup outpatient  -HTN,  Blood pressure normal. Continue home medications   GERD, stable. Continue PPI   OA, stable. Continue oxycodone. Rolling walker to bedside     Code Status:Full Code Family Communication:Family on the phone Disposition Plan: Home in 2-3 days  Shannon Posey, MD  [facility] Pager [phone]  [email or web address] Password [email or web address] 09/03/2012, 5:31 PM   LOS: 2 days   Brief narrative: Patient is a 61 year old Spanish female that came in with bilateral lower extremity cellulitis.  Consultants:  None  Procedures:  2-D echo showed normal LVEF  Duplex ultrasound of bilateral lower extremity negative for  DVT  Antibiotics:  Clindamycin ?  HPI/Subjective:  Improvement in the redness of lower extremity. No other complaint.  Objective: Filed Vitals:   09/02/12 1544 09/02/12 2230 09/03/12 0600 09/03/12 1447  BP: 121/72 119/74 129/72 138/87  Pulse: 75 73 75 68  Temp: 98.2 F (36.8 C) 98.6 F (37 C) 98 F (36.7 C) 97.9 F (36.6 C)  TempSrc: Oral Oral Oral Oral  Resp: 18 18 18 18   Height:      Weight:      SpO2: 93% 93% 91% 94%    Intake/Output Summary (Last 24 hours) at 09/03/12 1731 Last data filed at 09/02/12 2250  Gross per 24 hour  Intake    240 ml  Output      0 ml  Net    240 ml    Exam:  General: Hispanic obese female, no acute distress,  Eyes: PERRL, anicteric, ENT: no exudates   Neck:  Thyromegaly. Cardiovascular: RRR, no m/r/g.  Respiratory:  CTAB  Abdomen: NABS, soft, nondistended, obese, nontender  Skin: Erythema of the skin  bilateral lower extremities that is mildly tender to palpation. Large varicose veins bilaterally. Erythema extends from 2-3" below the knees bilaterally to the ankle area without clear demarkation. Musculoskeletal: 2+ pitting edema of lower extremities  Psychiatric: A&Ox4  Neurologic: III-XII intact, strength 5/5 throughout, sensation intact to light touch      Data Reviewed: Basic Metabolic Panel:  Lab 09/03/12 8295 09/01/12 2330  NA 136 136  K 3.1* 3.2*  CL 100 98  CO2 28 27  GLUCOSE 101* 103*  BUN 10 9  CREATININE 0.57 0.54  CALCIUM 8.8 8.9  MG -- --  PHOS -- --   Liver Function Tests:  Lab 09/01/12 2330  AST 37  ALT 41*  ALKPHOS 53  BILITOT 0.3  PROT 6.3  ALBUMIN 3.3*   CBC:  Lab 09/03/12 0524 09/01/12 2330  WBC 5.1 6.5  NEUTROABS -- 3.9  HGB 12.8 13.2  HCT 39.2 40.2  MCV 90.7 91.2  PLT 321 296   Cardiac Enzymes:  Lab 09/02/12 0248  CKTOTAL --  CKMB --  CKMBINDEX --  TROPONINI <0.30   Studies: Dg Chest 2 View  09/01/2012  *RADIOLOGY REPORT*  Clinical Data: Shortness of breath, wheezing and leg swelling.  CHEST - 2 VIEW  Comparison: Chest radiograph performed 03/03/2012  Findings: The lungs are well-aerated.  Vascular congestion is noted, with increased interstitial markings, compatible with pulmonary edema.  Small bilateral pleural effusions are seen, with associated lateral pleural thickening.  There is no evidence of pneumothorax.  The heart is mildly enlarged.  The patient's known chronic thyromegaly is grossly unchanged in appearance; there is associated narrowing of the subglottic trachea.  No acute osseous abnormalities are seen.  IMPRESSION:  1.  Vascular congestion, with increased interstitial markings and small bilateral pleural effusions, compatible with pulmonary edema. Mild cardiomegaly noted. 2.  Known chronic thyromegaly, grossly unchanged in appearance.  Associated narrowing of the subglottic trachea.   Original Report Authenticated By: Tonia Ghent, M.D.    Ct Angio Chest Pe W/cm &/or Wo Cm  09/02/2012  *RADIOLOGY REPORT*  Clinical Data: Shortness of breath; bilateral lower extremity pain and swelling.  Elevated D-dimer.  Back pain.  CT ANGIOGRAPHY CHEST  Technique:  Multidetector CT imaging of the chest using the standard protocol during bolus administration of intravenous contrast. Multiplanar reconstructed images including MIPs were obtained and reviewed to evaluate the vascular anatomy.  Contrast: OMNIPAQUE IOHEXOL 350 MG/ML SOLN  Comparison: Chest radiograph performed 09/01/2012, and CTA of the chest performed 07/16/2010  Findings: There is no evidence of pulmonary embolus.  Mild diffuse interstitial prominence is noted, likely reflecting mild interstitial edema given the appearance on the prior chest radiograph.  No definite pleural effusion or pneumothorax is seen. No masses are identified; no abnormal focal contrast enhancement is seen.  The mediastinum is grossly unremarkable in appearance.  There is no evidence of mediastinal lymphadenopathy.  No pericardial effusion is identified.  The great vessels are unremarkable in appearance. Incidental note is again made of an enlarged thyroid gland, with narrowing of the subglottic trachea.  No axillary lymphadenopathy is seen.  The visualized portions of the thyroid gland are unremarkable in appearance.  The visualized portions of the liver and spleen are unremarkable. A small to moderate hiatal hernia is noted.  No acute osseous abnormalities are seen.  IMPRESSION:  1.  No evidence of pulmonary embolus. 2.  Mild diffuse interstitial prominence noted, likely reflecting mild interstitial edema given the appearance on the prior chest radiograph. 3.  Enlarged thyroid gland, with associated narrowing of the subglottic trachea; this is grossly stable from 2011. 4.  Small to moderate hiatal hernia seen.    Original Report Authenticated By: Tonia Ghent, M.D.     Scheduled Meds:   . amLODipine  5 mg Oral Daily  . clindamycin (CLEOCIN) IV  900 mg Intravenous Q8H  . enoxaparin (LOVENOX) injection  60 mg Subcutaneous Q24H  . [COMPLETED] furosemide  40 mg Intravenous Once  . metoprolol  50 mg Oral BID  . naproxen  500 mg Oral BID WC  . pantoprazole  40 mg Oral Daily  . [COMPLETED] potassium chloride  40 mEq Oral Once  . [DISCONTINUED] amLODipine  10 mg Oral Daily   Continuous Infusions:    Time Spent: 25 min

## 2012-09-04 ENCOUNTER — Ambulatory Visit (HOSPITAL_COMMUNITY): Payer: Self-pay

## 2012-09-04 DIAGNOSIS — R143 Flatulence: Secondary | ICD-10-CM

## 2012-09-04 DIAGNOSIS — R140 Abdominal distension (gaseous): Secondary | ICD-10-CM | POA: Diagnosis present

## 2012-09-04 DIAGNOSIS — E010 Iodine-deficiency related diffuse (endemic) goiter: Secondary | ICD-10-CM | POA: Diagnosis present

## 2012-09-04 LAB — BASIC METABOLIC PANEL
Calcium: 9 mg/dL (ref 8.4–10.5)
Creatinine, Ser: 0.57 mg/dL (ref 0.50–1.10)
GFR calc non Af Amer: >90 mL/min (ref >90)
Sodium: 137 mEq/L (ref 135–145)

## 2012-09-04 LAB — CBC
MCH: 29.8 pg (ref 26.0–34.0)
MCHC: 33.1 g/dL (ref 30.0–36.0)
MCV: 90.2 fL (ref 78.0–100.0)
Platelets: 290 10*3/uL (ref 150–400)
RDW: 14.5 % (ref 11.5–15.5)

## 2012-09-04 MED ORDER — FUROSEMIDE 10 MG/ML IJ SOLN
40.0000 mg | Freq: Once | INTRAMUSCULAR | Status: AC
  Administered 2012-09-04: 40 mg via INTRAVENOUS
  Filled 2012-09-04: qty 4

## 2012-09-04 MED ORDER — DIPHENOXYLATE-ATROPINE 2.5-0.025 MG PO TABS
2.0000 | ORAL_TABLET | Freq: Once | ORAL | Status: AC
  Administered 2012-09-04: 2 via ORAL
  Filled 2012-09-04: qty 2

## 2012-09-04 MED ORDER — PANTOPRAZOLE SODIUM 40 MG PO TBEC
40.0000 mg | DELAYED_RELEASE_TABLET | Freq: Two times a day (BID) | ORAL | Status: DC
  Administered 2012-09-04 – 2012-09-07 (×6): 40 mg via ORAL
  Filled 2012-09-04 (×7): qty 1

## 2012-09-04 MED ORDER — GI COCKTAIL ~~LOC~~
30.0000 mL | Freq: Once | ORAL | Status: AC
  Administered 2012-09-04: 30 mL via ORAL
  Filled 2012-09-04: qty 30

## 2012-09-04 MED ORDER — IOHEXOL 300 MG/ML  SOLN
100.0000 mL | Freq: Once | INTRAMUSCULAR | Status: AC | PRN
  Administered 2012-09-04: 100 mL via INTRAVENOUS

## 2012-09-04 MED ORDER — ALUM & MAG HYDROXIDE-SIMETH 200-200-20 MG/5ML PO SUSP
30.0000 mL | ORAL | Status: DC | PRN
  Administered 2012-09-05 – 2012-09-06 (×4): 30 mL via ORAL
  Filled 2012-09-04 (×3): qty 30

## 2012-09-04 NOTE — Progress Notes (Signed)
[facility] PROGRESS NOTE  Shannon [name]:[id number] DOB: 1951/06/19 DOA: 09/01/2012 PCP: [name], MD  Assessment/Plan: Bilateral lower extremity Edema/Erythema  Elevated d-dimer. Lower extremity erythema: Ultrasound negative for  DVT. Most likely patient has venous stasis with some cellulitis.  CT Angio negative for PE. Continue IV clindamycin. For the edema we'll give one dose of Lasix IV Pain control with tylenol and oxycodone (home med)    Thyromegaly, stable. Followup outpatient.  Per daughter stated that patient was on Thyroid replacement  Prior to admission.  Will need to bring in the doses.  -HTN,  Blood pressure normal. Continue Metoprolol,  Hold Norvasc.  This is probably contributing to lower extremity edema.   GERD, stable. Continue PPI.  Patient wants to bring in her Nexium.  She states that that is the only thing that works.   OA, stable.  Continue Oxycodone. Rolling walker to bedside.  Patient to have Surgery later this year on left hip.   Abdominal Pain Will get CT of the abdomen to evaluate.    Code Status:Full Code Family Communication:Family on the phone Disposition Plan: Home in 2-3 days  [name], MD  [facility] Pager [phone]  [email or web address] Password [email or web address] 09/03/2012, 5:31 PM   LOS: 2 days   Brief narrative: Patient is a 61 year old Spanish female that came in with bilateral lower extremity cellulitis.  Consultants:  None  Procedures:  2-D echo showed normal LVEF  Duplex ultrasound of bilateral lower extremity negative for  DVT  Antibiotics:  Clindamycin ?  HPI/Subjective:  Improvement in the redness of lower extremity. No other complaint.  Objective: Filed Vitals:   09/02/12 1544 09/02/12 2230 09/03/12 0600 09/03/12 1447  BP: 121/72 119/74 129/72 138/87  Pulse: 75 73 75 68  Temp: 98.2 F (36.8 C) 98.6 F (37 C) 98 F (36.7 C) 97.9 F (36.6 C)  TempSrc: Oral Oral Oral Oral  Resp: 18  18 18 18   Height:      Weight:      SpO2: 93% 93% 91% 94%    Intake/Output Summary (Last 24 hours) at 09/03/12 1731 Last data filed at 09/02/12 2250  Gross per 24 hour  Intake    240 ml  Output      0 ml  Net    240 ml    Exam:  General: Hispanic obese female, no acute distress,  Eyes: PERRL, anicteric, ENT: no exudates   Neck:  Thyromegaly. Cardiovascular: RRR, no m/r/g.  Respiratory: CTAB  Abdomen: NABS, soft, nondistended, obese, nontender  Skin: Erythema of the skin  bilateral lower extremities that is mildly tender to palpation. Large varicose veins bilaterally. Erythema extends from 2-3" below the knees bilaterally to the ankle area without clear demarkation. Musculoskeletal: 2+ pitting edema of lower extremities  Psychiatric: A&Ox4  Neurologic: III-XII intact, strength 5/5 throughout, sensation intact to light touch      Data Reviewed: Basic Metabolic Panel:  Lab 09/03/12 8295 09/01/12 2330  NA 136 136  K 3.1* 3.2*  CL 100 98  CO2 28 27  GLUCOSE 101* 103*  BUN 10 9  CREATININE 0.57 0.54  CALCIUM 8.8 8.9  MG -- --  PHOS -- --   Liver Function Tests:  Lab 09/01/12 2330  AST 37  ALT 41*  ALKPHOS 53  BILITOT 0.3  PROT 6.3  ALBUMIN 3.3*   CBC:  Lab 09/03/12 0524 09/01/12 2330  WBC 5.1 6.5  NEUTROABS -- 3.9  HGB 12.8 13.2  HCT 39.2 40.2  MCV 90.7  91.2  PLT 321 296   Cardiac Enzymes:  Lab 09/02/12 0248  CKTOTAL --  CKMB --  CKMBINDEX --  TROPONINI <0.30   Studies: Dg Chest 2 View  09/01/2012  *RADIOLOGY REPORT*  Clinical Data: Shortness of breath, wheezing and leg swelling.  CHEST - 2 VIEW  Comparison: Chest radiograph performed 03/03/2012  Findings: The lungs are well-aerated.  Vascular congestion is noted, with increased interstitial markings, compatible with pulmonary edema.  Small bilateral pleural effusions are seen, with associated lateral pleural thickening.  There is no evidence of pneumothorax.  The heart is mildly enlarged.  The  patient's known chronic thyromegaly is grossly unchanged in appearance; there is associated narrowing of the subglottic trachea.  No acute osseous abnormalities are seen.  IMPRESSION:  1.  Vascular congestion, with increased interstitial markings and small bilateral pleural effusions, compatible with pulmonary edema. Mild cardiomegaly noted. 2.  Known chronic thyromegaly, grossly unchanged in appearance. Associated narrowing of the subglottic trachea.   Original Report Authenticated By: Tonia Ghent, M.D.    Ct Angio Chest Pe W/cm &/or Wo Cm  09/02/2012  *RADIOLOGY REPORT*  Clinical Data: Shortness of breath; bilateral lower extremity pain and swelling.  Elevated D-dimer.  Back pain.  CT ANGIOGRAPHY CHEST  Technique:  Multidetector CT imaging of the chest using the standard protocol during bolus administration of intravenous contrast. Multiplanar reconstructed images including MIPs were obtained and reviewed to evaluate the vascular anatomy.  Contrast: OMNIPAQUE IOHEXOL 350 MG/ML SOLN  Comparison: Chest radiograph performed 09/01/2012, and CTA of the chest performed 07/16/2010  Findings: There is no evidence of pulmonary embolus.  Mild diffuse interstitial prominence is noted, likely reflecting mild interstitial edema given the appearance on the prior chest radiograph.  No definite pleural effusion or pneumothorax is seen. No masses are identified; no abnormal focal contrast enhancement is seen.  The mediastinum is grossly unremarkable in appearance.  There is no evidence of mediastinal lymphadenopathy.  No pericardial effusion is identified.  The great vessels are unremarkable in appearance. Incidental note is again made of an enlarged thyroid gland, with narrowing of the subglottic trachea.  No axillary lymphadenopathy is seen.  The visualized portions of the thyroid gland are unremarkable in appearance.  The visualized portions of the liver and spleen are unremarkable. A small to moderate hiatal hernia  is noted.  No acute osseous abnormalities are seen.  IMPRESSION:  1.  No evidence of pulmonary embolus. 2.  Mild diffuse interstitial prominence noted, likely reflecting mild interstitial edema given the appearance on the prior chest radiograph. 3.  Enlarged thyroid gland, with associated narrowing of the subglottic trachea; this is grossly stable from 2011. 4.  Small to moderate hiatal hernia seen.   Original Report Authenticated By: Tonia Ghent, M.D.     Scheduled Meds:   . amLODipine  5 mg Oral Daily  . clindamycin (CLEOCIN) IV  900 mg Intravenous Q8H  . enoxaparin (LOVENOX) injection  60 mg Subcutaneous Q24H  . [COMPLETED] furosemide  40 mg Intravenous Once  . metoprolol  50 mg Oral BID  . naproxen  500 mg Oral BID WC  . pantoprazole  40 mg Oral Daily  . [COMPLETED] potassium chloride  40 mEq Oral Once  . [DISCONTINUED] amLODipine  10 mg Oral Daily   Continuous Infusions:    Time Spent: 25 min

## 2012-09-05 MED ORDER — METRONIDAZOLE IN NACL 5-0.79 MG/ML-% IV SOLN
500.0000 mg | Freq: Three times a day (TID) | INTRAVENOUS | Status: DC
  Administered 2012-09-05 – 2012-09-07 (×6): 500 mg via INTRAVENOUS
  Filled 2012-09-05 (×8): qty 100

## 2012-09-05 MED ORDER — FUROSEMIDE 10 MG/ML IJ SOLN
40.0000 mg | Freq: Once | INTRAMUSCULAR | Status: AC
  Administered 2012-09-05: 40 mg via INTRAVENOUS
  Filled 2012-09-05: qty 4

## 2012-09-05 NOTE — Plan of Care (Signed)
Problem: Phase I Progression Outcomes Goal: Wound assessment- dressing change as appropriate Outcome: Not Applicable Date Met:  09/05/12 No dressing indicated

## 2012-09-05 NOTE — Progress Notes (Signed)
[facility] PROGRESS NOTE  Shannon [name] ZOX:[id number] DOB: 1951-05-25 DOA: 09/01/2012 PCP: [name], MD  Assessment/Plan: Bilateral lower extremity Edema/Erythema  Elevated d-dimer. Lower extremity erythema: Ultrasound negative for  DVT. Most likely patient has venous stasis with some cellulitis.  CT Angio negative for PE.  CT of the abdomen and pelvis only shows question of mild colitis and duodenitis. Continue IV clindamycin. For the edema we'll give one more dose of Lasix IV Pain control with tylenol and oxycodone (home med)    Thyromegaly, stable. Followup outpatient.  Per daughter stated that patient was on Thyroid replacement  Prior to admission.  Reviewed patient's bottles of medications. She did not have a thyroid medication.  -HTN,  Blood pressure normal. Continue Metoprolol,  D/C Norvasc.  This is probably contributing to lower extremity edema.   GERD/Duodenitis Continue PPI.  Patient wants to bring in her Nexium.  She states that that is the only thing that works.   Discussed with patient getting EGD and colonoscopy outpatient.  OA, stable.  Continue Oxycodone. Rolling walker to bedside.  Patient to have Surgery later this year on left hip.   Abdominal pain Difficult to tell if patient is really having abdominal pain. She states that her belly is not tender, she just feels like she is having a lot of reflux. Placed her on PPI every 12 and also place her on empiric Flagyl with the findings of duodenitis and some colitis seen on the CAT scan.   Code Status:Full Code Family Communication:Family on the phone Disposition Plan: Home in 2-3 days  Shannon Posey, MD  [facility] Pager [phone]  [email or web address] Password [email or web address] 09/03/2012, 5:31 PM   LOS: 2 days   Brief narrative: Patient is a 61 year old Spanish female that came in with bilateral lower extremity cellulitis.  Consultants:  None  Procedures:  2-D echo showed normal  LVEF  Duplex ultrasound of bilateral lower extremity negative for  DVT  Antibiotics:  Clindamycin ?  HPI/Subjective:  Improvement in the redness of lower extremity. No other complaint.  Objective: Filed Vitals:   09/02/12 1544 09/02/12 2230 09/03/12 0600 09/03/12 1447  BP: 121/72 119/74 129/72 138/87  Pulse: 75 73 75 68  Temp: 98.2 F (36.8 C) 98.6 F (37 C) 98 F (36.7 C) 97.9 F (36.6 C)  TempSrc: Oral Oral Oral Oral  Resp: 18 18 18 18   Height:      Weight:      SpO2: 93% 93% 91% 94%    Intake/Output Summary (Last 24 hours) at 09/03/12 1731 Last data filed at 09/02/12 2250  Gross per 24 hour  Intake    240 ml  Output      0 ml  Net    240 ml    Exam:  General: Hispanic obese female, no acute distress,  Eyes: PERRL, anicteric, ENT: no exudates   Neck:  Thyromegaly. Cardiovascular: RRR, no m/r/g.  Respiratory: CTAB  Abdomen: NABS, soft, nondistended, obese, nontender  Skin: Erythema of the skin  bilateral lower extremities that is mildly tender to palpation. Large varicose veins bilaterally. Erythema extends from 2-3" below the knees bilaterally to the ankle area without clear demarkation. Musculoskeletal: 2+ pitting edema of lower extremities  Psychiatric: A&Ox4  Neurologic: III-XII intact, strength 5/5 throughout, sensation intact to light touch      Data Reviewed: Basic Metabolic Panel:  Lab 09/03/12 8295 09/01/12 2330  NA 136 136  K 3.1* 3.2*  CL 100 98  CO2 28 27  GLUCOSE 101* 103*  BUN 10 9  CREATININE 0.57 0.54  CALCIUM 8.8 8.9  MG -- --  PHOS -- --   Liver Function Tests:  Lab 09/01/12 2330  AST 37  ALT 41*  ALKPHOS 53  BILITOT 0.3  PROT 6.3  ALBUMIN 3.3*   CBC:  Lab 09/03/12 0524 09/01/12 2330  WBC 5.1 6.5  NEUTROABS -- 3.9  HGB 12.8 13.2  HCT 39.2 40.2  MCV 90.7 91.2  PLT 321 296   Cardiac Enzymes:  Lab 09/02/12 0248  CKTOTAL --  CKMB --  CKMBINDEX --  TROPONINI <0.30   Studies: Dg Chest 2 View  09/01/2012   *RADIOLOGY REPORT*  Clinical Data: Shortness of breath, wheezing and leg swelling.  CHEST - 2 VIEW  Comparison: Chest radiograph performed 03/03/2012  Findings: The lungs are well-aerated.  Vascular congestion is noted, with increased interstitial markings, compatible with pulmonary edema.  Small bilateral pleural effusions are seen, with associated lateral pleural thickening.  There is no evidence of pneumothorax.  The heart is mildly enlarged.  The patient's known chronic thyromegaly is grossly unchanged in appearance; there is associated narrowing of the subglottic trachea.  No acute osseous abnormalities are seen.  IMPRESSION:  1.  Vascular congestion, with increased interstitial markings and small bilateral pleural effusions, compatible with pulmonary edema. Mild cardiomegaly noted. 2.  Known chronic thyromegaly, grossly unchanged in appearance. Associated narrowing of the subglottic trachea.   Original Report Authenticated By: Tonia Ghent, M.D.    Ct Angio Chest Pe W/cm &/or Wo Cm  09/02/2012  *RADIOLOGY REPORT*  Clinical Data: Shortness of breath; bilateral lower extremity pain and swelling.  Elevated D-dimer.  Back pain.  CT ANGIOGRAPHY CHEST  Technique:  Multidetector CT imaging of the chest using the standard protocol during bolus administration of intravenous contrast. Multiplanar reconstructed images including MIPs were obtained and reviewed to evaluate the vascular anatomy.  Contrast: OMNIPAQUE IOHEXOL 350 MG/ML SOLN  Comparison: Chest radiograph performed 09/01/2012, and CTA of the chest performed 07/16/2010  Findings: There is no evidence of pulmonary embolus.  Mild diffuse interstitial prominence is noted, likely reflecting mild interstitial edema given the appearance on the prior chest radiograph.  No definite pleural effusion or pneumothorax is seen. No masses are identified; no abnormal focal contrast enhancement is seen.  The mediastinum is grossly unremarkable in appearance.  There is  no evidence of mediastinal lymphadenopathy.  No pericardial effusion is identified.  The great vessels are unremarkable in appearance. Incidental note is again made of an enlarged thyroid gland, with narrowing of the subglottic trachea.  No axillary lymphadenopathy is seen.  The visualized portions of the thyroid gland are unremarkable in appearance.  The visualized portions of the liver and spleen are unremarkable. A small to moderate hiatal hernia is noted.  No acute osseous abnormalities are seen.  IMPRESSION:  1.  No evidence of pulmonary embolus. 2.  Mild diffuse interstitial prominence noted, likely reflecting mild interstitial edema given the appearance on the prior chest radiograph. 3.  Enlarged thyroid gland, with associated narrowing of the subglottic trachea; this is grossly stable from 2011. 4.  Small to moderate hiatal hernia seen.   Original Report Authenticated By: Tonia Ghent, M.D.     Scheduled Meds:   . amLODipine  5 mg Oral Daily  . clindamycin (CLEOCIN) IV  900 mg Intravenous Q8H  . enoxaparin (LOVENOX) injection  60 mg Subcutaneous Q24H  . [COMPLETED] furosemide  40 mg Intravenous Once  . metoprolol  50 mg Oral BID  .  naproxen  500 mg Oral BID WC  . pantoprazole  40 mg Oral Daily  . [COMPLETED] potassium chloride  40 mEq Oral Once  . [DISCONTINUED] amLODipine  10 mg Oral Daily   Continuous Infusions:    Time Spent: 25 min

## 2012-09-06 LAB — BASIC METABOLIC PANEL
CO2: 33 mEq/L — ABNORMAL HIGH (ref 19–32)
Calcium: 8.9 mg/dL (ref 8.4–10.5)
Chloride: 94 mEq/L — ABNORMAL LOW (ref 96–112)
Creatinine, Ser: 0.59 mg/dL (ref 0.50–1.10)
GFR calc Af Amer: >90 mL/min (ref >90)
Sodium: 134 mEq/L — ABNORMAL LOW (ref 135–145)

## 2012-09-06 LAB — CBC
Platelets: 267 10*3/uL (ref 150–400)
RBC: 4.01 MIL/uL (ref 3.87–5.11)
RDW: 14.7 % (ref 11.5–15.5)
WBC: 4.4 10*3/uL (ref 4.0–10.5)

## 2012-09-06 MED ORDER — CALCIUM CARBONATE ANTACID 500 MG PO CHEW: 1.0000 | CHEWABLE_TABLET | Freq: Three times a day (TID) | ORAL | Status: DC | PRN

## 2012-09-06 MED ORDER — METOPROLOL TARTRATE 12.5 MG HALF TABLET
12.5000 mg | ORAL_TABLET | Freq: Two times a day (BID) | ORAL | Status: DC
  Administered 2012-09-06 – 2012-09-07 (×2): 12.5 mg via ORAL
  Filled 2012-09-06 (×3): qty 1

## 2012-09-06 MED ORDER — ZOLPIDEM TARTRATE 5 MG PO TABS
5.0000 mg | ORAL_TABLET | Freq: Every evening | ORAL | Status: DC | PRN
  Filled 2012-09-06: qty 1

## 2012-09-06 MED ORDER — CLINDAMYCIN PHOSPHATE 600 MG/50ML IV SOLN
600.0000 mg | Freq: Three times a day (TID) | INTRAVENOUS | Status: DC
  Administered 2012-09-06 – 2012-09-07 (×2): 600 mg via INTRAVENOUS
  Filled 2012-09-06 (×4): qty 50

## 2012-09-06 MED ORDER — CALCIUM CARBONATE ANTACID 500 MG PO CHEW
1.0000 | CHEWABLE_TABLET | Freq: Four times a day (QID) | ORAL | Status: DC | PRN
  Filled 2012-09-06 (×2): qty 1

## 2012-09-06 MED ORDER — CLINDAMYCIN PHOSPHATE 600 MG/50ML IV SOLN: 600.0000 mg | Freq: Three times a day (TID) | INTRAVENOUS | Status: DC

## 2012-09-06 NOTE — Progress Notes (Signed)
[facility] PROGRESS NOTE  Shannon [name] ZOX:[id number] DOB: 12-04-1950 DOA: 09/01/2012 PCP: [name], MD  Assessment/Plan: Bilateral lower extremity Edema/Erythema  Elevated d-dimer. Lower extremity erythema: Ultrasound negative for  DVT. Most likely patient has venous stasis with some cellulitis.  CT Angio negative for PE.  CT of the abdomen and pelvis only shows question of mild colitis and duodenitis. Continue IV clindamycin. For the edema we'll give one more dose of Lasix IV Pain control with tylenol and oxycodone (home med)    Thyromegaly, stable. Followup outpatient.  Per daughter stated that patient was on Thyroid replacement  Prior to admission.  Reviewed patient's bottles of medications. She did not have a thyroid medication.  -HTN,  Blood pressure normal. Continue Metoprolol,  D/C Norvasc.  This is probably contributing to lower extremity edema.   GERD/Duodenitis Continue PPI.  Patient wants to bring in her Nexium.  She states that that is the only thing that works.   Patient continues to complain of epigastric discomfort. GI consulted  OA, stable.  Continue Oxycodone. Rolling walker to bedside.  Patient to have Surgery later this year on left hip.   Abdominal pain/question of colitis Continue PPI every 12 and also place her on empiric Flagyl with the findings of colitis seen on the CAT scan.  GI consulted.   Code Status:Full Code Family Communication:Family on the phone Disposition Plan: Home in 2-3 days  [name], MD  [facility] Pager [phone]  [email or web address] Password [email or web address] 09/03/2012, 5:31 PM   LOS: 2 days   Brief narrative: Patient is a 61 year old Spanish female that came in with bilateral lower extremity cellulitis.  Consultants:  None  Procedures:  2-D echo showed normal LVEF  Duplex ultrasound of bilateral lower extremity negative for  DVT  Antibiotics:  Clindamycin ?  HPI/Subjective:  Improvement in the  redness of lower extremity. Complains of epigastric pain and severe reflux. Objective: Filed Vitals:   09/02/12 1544 09/02/12 2230 09/03/12 0600 09/03/12 1447  BP: 121/72 119/74 129/72 138/87  Pulse: 75 73 75 68  Temp: 98.2 F (36.8 C) 98.6 F (37 C) 98 F (36.7 C) 97.9 F (36.6 C)  TempSrc: Oral Oral Oral Oral  Resp: 18 18 18 18   Height:      Weight:      SpO2: 93% 93% 91% 94%    Intake/Output Summary (Last 24 hours) at 09/03/12 1731 Last data filed at 09/02/12 2250  Gross per 24 hour  Intake    240 ml  Output      0 ml  Net    240 ml    Exam:  General: Hispanic obese female, no acute distress,  Eyes: PERRL, anicteric, ENT: no exudates   Neck:  Thyromegaly. Cardiovascular: RRR, no m/r/g.  Respiratory: CTAB  Abdomen: NABS, soft, nondistended, obese, nontender  Skin: Erythema of the skin  bilateral lower extremities that is mildly tender to palpation. Large varicose veins bilaterally. Erythema extends from 2-3" below the knees bilaterally to the ankle area without clear demarkation. Musculoskeletal: 2+ pitting edema of lower extremities  Psychiatric: A&Ox4  Neurologic: III-XII intact, strength 5/5 throughout, sensation intact to light touch      Data Reviewed: Basic Metabolic Panel:  Lab 09/03/12 8295 09/01/12 2330  NA 136 136  K 3.1* 3.2*  CL 100 98  CO2 28 27  GLUCOSE 101* 103*  BUN 10 9  CREATININE 0.57 0.54  CALCIUM 8.8 8.9  MG -- --  PHOS -- --   Liver Function Tests:  Lab 09/01/12 2330  AST 37  ALT 41*  ALKPHOS 53  BILITOT 0.3  PROT 6.3  ALBUMIN 3.3*   CBC:  Lab 09/03/12 0524 09/01/12 2330  WBC 5.1 6.5  NEUTROABS -- 3.9  HGB 12.8 13.2  HCT 39.2 40.2  MCV 90.7 91.2  PLT 321 296   Cardiac Enzymes:  Lab 09/02/12 0248  CKTOTAL --  CKMB --  CKMBINDEX --  TROPONINI <0.30   Studies: Dg Chest 2 View  09/01/2012  *RADIOLOGY REPORT*  Clinical Data: Shortness of breath, wheezing and leg swelling.  CHEST - 2 VIEW  Comparison: Chest  radiograph performed 03/03/2012  Findings: The lungs are well-aerated.  Vascular congestion is noted, with increased interstitial markings, compatible with pulmonary edema.  Small bilateral pleural effusions are seen, with associated lateral pleural thickening.  There is no evidence of pneumothorax.  The heart is mildly enlarged.  The patient's known chronic thyromegaly is grossly unchanged in appearance; there is associated narrowing of the subglottic trachea.  No acute osseous abnormalities are seen.  IMPRESSION:  1.  Vascular congestion, with increased interstitial markings and small bilateral pleural effusions, compatible with pulmonary edema. Mild cardiomegaly noted. 2.  Known chronic thyromegaly, grossly unchanged in appearance. Associated narrowing of the subglottic trachea.   Original Report Authenticated By: Tonia Ghent, M.D.    Ct Angio Chest Pe W/cm &/or Wo Cm  09/02/2012  *RADIOLOGY REPORT*  Clinical Data: Shortness of breath; bilateral lower extremity pain and swelling.  Elevated D-dimer.  Back pain.  CT ANGIOGRAPHY CHEST  Technique:  Multidetector CT imaging of the chest using the standard protocol during bolus administration of intravenous contrast. Multiplanar reconstructed images including MIPs were obtained and reviewed to evaluate the vascular anatomy.  Contrast: OMNIPAQUE IOHEXOL 350 MG/ML SOLN  Comparison: Chest radiograph performed 09/01/2012, and CTA of the chest performed 07/16/2010  Findings: There is no evidence of pulmonary embolus.  Mild diffuse interstitial prominence is noted, likely reflecting mild interstitial edema given the appearance on the prior chest radiograph.  No definite pleural effusion or pneumothorax is seen. No masses are identified; no abnormal focal contrast enhancement is seen.  The mediastinum is grossly unremarkable in appearance.  There is no evidence of mediastinal lymphadenopathy.  No pericardial effusion is identified.  The great vessels are  unremarkable in appearance. Incidental note is again made of an enlarged thyroid gland, with narrowing of the subglottic trachea.  No axillary lymphadenopathy is seen.  The visualized portions of the thyroid gland are unremarkable in appearance.  The visualized portions of the liver and spleen are unremarkable. A small to moderate hiatal hernia is noted.  No acute osseous abnormalities are seen.  IMPRESSION:  1.  No evidence of pulmonary embolus. 2.  Mild diffuse interstitial prominence noted, likely reflecting mild interstitial edema given the appearance on the prior chest radiograph. 3.  Enlarged thyroid gland, with associated narrowing of the subglottic trachea; this is grossly stable from 2011. 4.  Small to moderate hiatal hernia seen.   Original Report Authenticated By: Tonia Ghent, M.D.     Scheduled Meds:   . amLODipine  5 mg Oral Daily  . clindamycin (CLEOCIN) IV  900 mg Intravenous Q8H  . enoxaparin (LOVENOX) injection  60 mg Subcutaneous Q24H  . [COMPLETED] furosemide  40 mg Intravenous Once  . metoprolol  50 mg Oral BID  . naproxen  500 mg Oral BID WC  . pantoprazole  40 mg Oral Daily  . [COMPLETED] potassium chloride  40 mEq Oral  Once  . [DISCONTINUED] amLODipine  10 mg Oral Daily   Continuous Infusions:    Time Spent: 25 min

## 2012-09-06 NOTE — Consult Note (Signed)
[facility] CONSULT Reason for consult: Abdominal pain and abnormal CT Referring Physician:   Kaliah Haddaway is an 61 y.o. female.  HPI: Patient has a standing history is obtained by translation from the daughter. He has had a multitude of vague symptoms have been going on for several days is admitted to the hospital with swelling in her legs shortness of breath never. She has chronic nausea and has frequent heartburn which she is taking this medication he tends towards constipation and several days ago took some type of laxatives resulting in a large amount of diarrheal stool in her bowel was reported to be normal at this time. According to the records, she was taking omeprazole over-the-counter in [place] chronic pain as well as oxycodone. He reports that she has not had any prior gallbladder problems. He was admitted to the hospital and her liver tests revealed slight elevation of ALT otherwise was normal. She is being treated for cellulitis. He has a history of high thyroid. Her workup has included CT angiogram of the chest showed no signs of active pulmonary embolus in a hiatal hernia. CT of the abdomen was obtained there was thickening in the distal stomach and duodenum and possibly in the descending colon as well as a marked fatty liver. The patient apparently does have burning pain after eating that radiates up into her chest been going on for some time. His history again 10 her family interpretation  Past Medical History  Diagnosis Date  . Hypothyroidism   . Hypertension   . Varicose vein of leg   . GERD (gastroesophageal reflux disease)   . Osteoarthritis     Past Surgical History  Procedure Date  . Hip surgery 2008-9    right    Family History  Problem Relation Age of Onset  . High blood pressure      Social History:  reports that she has never smoked. She has never used smokeless tobacco. She reports that she does not drink alcohol or use illicit  drugs.  Allergies: No Known Allergies  Medications;    . clindamycin (CLEOCIN) IV  900 mg Intravenous Q8H  . enoxaparin (LOVENOX) injection  60 mg Subcutaneous Q24H  . [COMPLETED] furosemide  40 mg Intravenous Once  . metoprolol  50 mg Oral BID  . metronidazole  500 mg Intravenous Q8H  . pantoprazole  40 mg Oral BID AC  . [DISCONTINUED] naproxen  500 mg Oral BID WC   PRN Meds acetaminophen, acetaminophen, alum & mag hydroxide-simeth, diphenhydrAMINE, ondansetron (ZOFRAN) IV, ondansetron, oxyCODONE, senna-docusate Results for orders placed during the hospital encounter of 09/01/12 (from the past 48 hour(s))  CBC     Status: Normal   Collection Time   09/06/12  5:29 AM      Component Value Range Comment   WBC 4.4  4.0 - 10.5 K/uL    RBC 4.01  3.87 - 5.11 MIL/uL    Hemoglobin 12.2  12.0 - 15.0 g/dL    HCT 40.9  81.1 - 91.4 %    MCV 90.0  78.0 - 100.0 fL    MCH 30.4  26.0 - 34.0 pg    MCHC 33.8  30.0 - 36.0 g/dL    RDW 78.2  95.6 - 21.3 %    Platelets 267  150 - 400 K/uL   BASIC METABOLIC PANEL     Status: Abnormal   Collection Time   09/06/12  5:29 AM      Component Value Range Comment   Sodium  134 (*) 135 - 145 mEq/L    Potassium 3.0 (*) 3.5 - 5.1 mEq/L    Chloride 94 (*) 96 - 112 mEq/L    CO2 33 (*) 19 - 32 mEq/L    Glucose, Bld 115 (*) 70 - 99 mg/dL    BUN 4 (*) 6 - 23 mg/dL    Creatinine, Ser 8.65  0.50 - 1.10 mg/dL    Calcium 8.9  8.4 - 78.4 mg/dL    GFR calc non Af Amer >90  >90 mL/min    GFR calc Af Amer >90  >90 mL/min     Ct Abdomen Pelvis W Contrast  09/04/2012  *RADIOLOGY REPORT*  Clinical Data:  Abdominal pain and distension CT ABDOMEN AND PELVIS WITH CONTRAST  Technique:  Multidetector CT imaging of the abdomen and pelvis was performed following the standard protocol during bolus administration of intravenous contrast.  Oral contrast was also administered.  Contrast: OMNIPAQUE IOHEXOL 300 MG/ML  SOLN  Comparison: None.  Findings:  The lung bases are  clear.  There is a focal hiatal hernia.  There is fatty change in the liver.  No focal liver lesions are identified.  There is no biliary duct dilatation.  Spleen, pancreas, adrenals appear normal. There is a small accessory spleen in the left upper quadrant.  Flank kidneys bilaterally appear normal.  In the pelvis, there is artifact from a total hip prosthesis. There is no pelvic mass or fluid collection.  There is no bowel obstruction.  No free air or portal venous air. There is no periappendiceal region inflammation.  There is some fold thickening in the distal stomach and proximal duodenum.  There is also some mild fold thickening in the at ascending colon.  These areas may represent some localized duodenitis and proximal colitis.  The surrounding mesentery, however, is not thickened.  There is no ascites, adenopathy, or abscess in the abdomen or pelvis.  Aorta is nonaneurysmal.  There is atherosclerotic change in the aorta.  There are no blastic or lytic bone lesions.  There is degenerative change in the left hip joint and lumbar spine regions.   IMPRESSION: Evidence suggesting duodenitis and mild ascending colitis.  No abscess.  No mesenteric inflammation.  No diverticulitis.  Focal hiatal hernia.  Fatty liver.  Arthropathy in the left hip joint and lumbar spine.  Status post total right hip replacement.   Original Report Authenticated By: Bretta Bang, M.D.    ROS: As above Constitutional: HEENT: Cardiovascular: Respiratory: GI: GU: Musculoskeletal: Neuro/Psychiatric: Endocrine/Heme:            Blood pressure 103/50, pulse 67, temperature 98.2 F (36.8 C), temperature source Oral, resp. rate 20, height 5\' 4"  (1.626 m), weight 123.424 kg (272 lb 1.6 oz), SpO2 98.00%.  Physical exam:   Gen.-morbidly obese Hispanic female in no acute distress Eyes-sclerae are nonicteric Lungs-grossly normal Heart-regular rate and rhythm without murmurs or gallops Abdomen-morbidly obese With  excellent bowel sounds minimal tenderness   Assessment: 1. Chronic esophageal reflux probably contributed by her marked obesity. Her CT scan does show some thickening of the duodenum and antrum which is probably due to inflammation.  Plan: We will guaiac her stools and follow her clinically and treat her aggressively with twice a day PPI therapy. If not better in the next several days she may need EGD.   Arlethia Basso JR,Anielle Headrick L 09/06/2012, 8:45 AM

## 2012-09-07 DIAGNOSIS — R609 Edema, unspecified: Secondary | ICD-10-CM | POA: Diagnosis present

## 2012-09-07 DIAGNOSIS — E876 Hypokalemia: Secondary | ICD-10-CM | POA: Diagnosis present

## 2012-09-07 LAB — BASIC METABOLIC PANEL
CO2: 30 mEq/L (ref 19–32)
Chloride: 93 mEq/L — ABNORMAL LOW (ref 96–112)
Creatinine, Ser: 0.54 mg/dL (ref 0.50–1.10)
GFR calc Af Amer: >90 mL/min (ref >90)
Potassium: 3.1 mEq/L — ABNORMAL LOW (ref 3.5–5.1)

## 2012-09-07 LAB — CBC
MCV: 88 fL (ref 78.0–100.0)
Platelets: 277 10*3/uL (ref 150–400)
RBC: 4.24 MIL/uL (ref 3.87–5.11)
RDW: 14.2 % (ref 11.5–15.5)
WBC: 4.6 10*3/uL (ref 4.0–10.5)

## 2012-09-07 MED ORDER — ESOMEPRAZOLE MAGNESIUM 40 MG PO CPDR: 40.0000 mg | DELAYED_RELEASE_CAPSULE | Freq: Every day | ORAL | Status: DC

## 2012-09-07 MED ORDER — POTASSIUM CHLORIDE CRYS ER 20 MEQ PO TBCR: 20.0000 meq | EXTENDED_RELEASE_TABLET | Freq: Every day | ORAL | Status: DC | PRN

## 2012-09-07 MED ORDER — POTASSIUM CHLORIDE CRYS ER 20 MEQ PO TBCR
40.0000 meq | EXTENDED_RELEASE_TABLET | Freq: Once | ORAL | Status: AC
  Administered 2012-09-07: 40 meq via ORAL
  Filled 2012-09-07: qty 2

## 2012-09-07 MED ORDER — FUROSEMIDE 20 MG PO TABS: 20.0000 mg | ORAL_TABLET | Freq: Every day | ORAL | Status: DC | PRN

## 2012-09-07 MED ORDER — DOXYCYCLINE HYCLATE 100 MG PO TABS: 100.0000 mg | ORAL_TABLET | Freq: Two times a day (BID) | ORAL | Status: DC

## 2012-09-07 MED ORDER — METOPROLOL TARTRATE 12.5 MG HALF TABLET: 50.0000 mg | ORAL_TABLET | Freq: Two times a day (BID) | ORAL | Status: DC

## 2012-09-07 NOTE — Progress Notes (Signed)
[facility] PROGRESS NOTE Subjective Discussed her symptoms with her family as interpreter. She still is having some reflux symptoms but notes that it is better. It appears that she been on Nexium in the past which helped stop taking everything when her prescription ran out other than Mylanta did not help. The family and the patient described what appears to be esophageal reflux with postprandial burning up into her chest. She states that she is much better here in the hospital but still has a poor appetite  Objective: Vital signs in last 24 hours: Temp:  [98 F (36.7 C)-98.2 F (36.8 C)] 98 F (36.7 C) (11/10 0656) Pulse Rate:  [66-77] 72  (11/10 0710) Resp:  [18] 18  (11/10 0710) BP: (95-129)/(48-71) 124/71 mmHg (11/10 0710) SpO2:  [90 %-98 %] 91 % (11/10 0710) Last BM Date: 09/06/12  Intake/Output from previous day: 11/09 0701 - 11/10 0700 In: 460 [P.O.:360; IV Piggyback:100] Out: 250 [Urine:250] Intake/Output this shift:    PE: Gen.-morbidly obese female Abdomen-morbidly obese generally nontender   Lab Results:  Basename 09/07/12 0553 09/06/12 0529  WBC 4.6 4.4  HGB 12.6 12.2  HCT 37.3 36.1  PLT 277 267   BMET  Basename 09/07/12 0553 09/06/12 0529  NA 131* 134*  K 3.1* 3.0*  CL 93* 94*  CO2 30 33*  CREATININE 0.54 0.59   LFT No results found for this basename: PROT:3ALBUMIN:3,AST:3,ALT:3,ALKPHOS:3,BILITOT:3,BILIDIR:3,IBILI:3 in the last 72 hours PT/INR No results found for this basename: LABPROT:3,INR:3 in the last 72 hours PANCREAS No results found for this basename: LIPASE:3 in the last 72 hours       Studies/Results: No results found.  Medications: I have reviewed the patient's current medications.  Assessment/Plan: 1. Dyspepsia probably due to esophageal reflux patient better on PPI therapy and this should need to be continued 2. Questionable duodenitis/thickening colon on CT. Suspect this is acid related since she is better on PPI  therapy  Recommendation. We'll go ahead and discharge her on twice a day PPI therapy. I did explain to the family they can purchase Prilosec OTC take 2 tablets twice a day. This may be cheaper for that. She seems to really like Nexium and has a family member that we'll pay for Nexium so we could discharge him as well. At this point, I don't think she needs EGD. We'll be happy to see her back if not improved.    Marlyss Cissell JR,Takara Sermons L 09/07/2012, 8:09 AM

## 2012-09-07 NOTE — Progress Notes (Signed)
Pt discharged to home. DC instructions given with family at bedside using niece as interpreter. No concerns voiced. Prescriptions x 5 given. Left unit in wheelchair pushed by nurse tech accompanied by family members. Left in good condition.

## 2012-09-07 NOTE — Discharge Summary (Signed)
Physician Discharge Summary  Shannon [name] ZOX:[id number] DOB: [birth year] DOA: 09/01/2012  PCP: [name], MD  Admit date: 09/01/2012 Discharge date: 09/07/2012  Recommendations for Outpatient Follow-up:   Follow-up Information    Follow up with [name], MD. In 1 week.   Contact information:   [address] [place] [address] [phone]         Discharge Diagnoses:  Principal Problem: Cellulitis of leg  Active Problems:  Hypertension  Varicose vein of leg  GERD (gastroesophageal reflux disease)  Osteoarthritis  Bilateral lower extremity edema  Elevated d-dimer  Abdominal distention  Thyromegaly  Hypokalemia  Edema  Discharge Condition: stable Disposition: Home  Diet recommendation: heart Healthy  Filed Weights   09/02/12 0320  Weight: 123.424 kg (272 lb 1.6 oz)    History of present illness:  History obtained via phone interpreter from patient and her daughter.  The patient is a 61 year old female with history of hypothyroidism per chart but not per patient history, hypertension, GERD, osteoarthritis and varicose veins who presented with a 2 day history of bilateral lower extremity pain and redness. She also had a fever two days ago to 100.29F. She states that her legs have been gradually becoming more swollen, but the pain and redness only started recently. She denies DOE, orthopnea, PND. She denies urinary symptoms as below, however, her urine is dark in the urine specimen cup. She has had nausea for 2-3 days.    Hospital Course:  Bilateral lower extremity Edema/Erythema/ Elevated D-dimer.  Lower extremity erythema: Ultrasound negative for DVT. Most likely patient has venous stasis with some cellulitis. CT Angio negative for PE. CT of the abdomen and pelvis only shows question of mild colitis and duodenitis.   Patient was treated inpatient with IV clindamycin then discharged on doxycycline to complete course of therapy.  Erythema  of bilateral lower extremity has greatly improved.  Most of the erythema though was probably caused by the edema with some venous insufficiency.   Pain control with tylenol and oxycodone (home med)   Thyromegaly Stable, Followup outpatient. Per daughter stated that patient was on Thyroid replacement Prior to admission. Reviewed patient's bottles of medications. She did not have a thyroid medication.   HTN,  Blood pressure was low during this admission. Discontinued the Norvasc which probably was causing some edema and decreased her metoprolol. Also discharge her on when necessary Lasix and potassium. Continue Metoprolol, D/C Norvasc. This is probably was contributing to her lower extremity edema.   GERD/Duodenitis  Patient was evaluated by GI. Endoscopy is not recommended at this time. Continue PPI twice a day then back to daily. n  OA, stable.  Continue Oxycodone. Rolling walker to bedside. Patient to have Surgery later this year on left hip.   ?question of colitis  Patient was placed on empiric Flagyl with the findings of colitis seen on the CAT scan. Patient did not have any tenderness in that area. She did not have a white count or fever. Based on clinical exam I do not think she has colitis. Did not discharge her on Flagyl.    Hypokalemia Hypokalemia most likely secondary to Lasix. Replete and given prescription for potassium.   Discharge Instructions     Medication List     As of 09/07/2012 12:18 PM    STOP taking these medications         amLODipine 10 MG tablet   Commonly known as: NORVASC      omeprazole 20 MG capsule  Commonly known as: PRILOSEC      TAKE these medications         diphenhydrAMINE 25 mg capsule   Commonly known as: BENADRYL   Take 25 mg by mouth every 6 (six) hours as needed. Itching      doxycycline 100 MG tablet   Commonly known as: VIBRA-TABS   Take 1 tablet (100 mg total) by mouth 2 (two) times daily.      esomeprazole 40 MG capsule    Commonly known as: NEXIUM   Take 1 capsule (40 mg total) by mouth daily before breakfast.      furosemide 20 MG tablet   Commonly known as: LASIX   Take 1 tablet (20 mg total) by mouth daily as needed (leg swelling).      metoprolol tartrate 12.5 mg Tabs   Commonly known as: LOPRESSOR   Take 2 tablets (50 mg total) by mouth 2 (two) times daily.      naproxen 500 MG tablet   Commonly known as: NAPROSYN   Take 500 mg by mouth 2 (two) times daily with a meal.      oxycodone 5 MG capsule   Commonly known as: OXY-IR   Take 5 mg by mouth every 4 (four) hours as needed. For pain      potassium chloride SA 20 MEQ tablet   Commonly known as: K-DUR,KLOR-CON   Take 1 tablet (20 mEq total) by mouth daily as needed.           Follow-up Information    Follow up with [name], MD. In 1 week.   Contact information:   [address] [place] [address] [phone]           The results of significant diagnostics from this hospitalization (including imaging, microbiology, ancillary and laboratory) are listed below for reference.    Significant Diagnostic Studies: Dg Chest 2 View  09/01/2012  *RADIOLOGY REPORT*  Clinical Data: Shortness of breath, wheezing and leg swelling.  CHEST - 2 VIEW  Comparison: Chest radiograph performed 03/03/2012  Findings: The lungs are well-aerated.  Vascular congestion is noted, with increased interstitial markings, compatible with pulmonary edema.  Small bilateral pleural effusions are seen, with associated lateral pleural thickening.  There is no evidence of pneumothorax.  The heart is mildly enlarged.  The patient's known chronic thyromegaly is grossly unchanged in appearance; there is associated narrowing of the subglottic trachea.  No acute osseous abnormalities are seen.  IMPRESSION:  1.  Vascular congestion, with increased interstitial markings and small bilateral pleural effusions, compatible with pulmonary edema. Mild cardiomegaly noted. 2.   Known chronic thyromegaly, grossly unchanged in appearance. Associated narrowing of the subglottic trachea.   Original Report Authenticated By: Tonia Ghent, M.D.    Ct Angio Chest Pe W/cm &/or Wo Cm  09/02/2012  *RADIOLOGY REPORT*  Clinical Data: Shortness of breath; bilateral lower extremity pain and swelling.  Elevated D-dimer.  Back pain.  CT ANGIOGRAPHY CHEST  Technique:  Multidetector CT imaging of the chest using the standard protocol during bolus administration of intravenous contrast. Multiplanar reconstructed images including MIPs were obtained and reviewed to evaluate the vascular anatomy.  Contrast: OMNIPAQUE IOHEXOL 350 MG/ML SOLN  Comparison: Chest radiograph performed 09/01/2012, and CTA of the chest performed 07/16/2010  Findings: There is no evidence of pulmonary embolus.  Mild diffuse interstitial prominence is noted, likely reflecting mild interstitial edema given the appearance on the prior chest radiograph.  No definite pleural effusion or pneumothorax  is seen. No masses are identified; no abnormal focal contrast enhancement is seen.  The mediastinum is grossly unremarkable in appearance.  There is no evidence of mediastinal lymphadenopathy.  No pericardial effusion is identified.  The great vessels are unremarkable in appearance. Incidental note is again made of an enlarged thyroid gland, with narrowing of the subglottic trachea.  No axillary lymphadenopathy is seen.  The visualized portions of the thyroid gland are unremarkable in appearance.  The visualized portions of the liver and spleen are unremarkable. A small to moderate hiatal hernia is noted.  No acute osseous abnormalities are seen.  IMPRESSION:  1.  No evidence of pulmonary embolus. 2.  Mild diffuse interstitial prominence noted, likely reflecting mild interstitial edema given the appearance on the prior chest radiograph. 3.  Enlarged thyroid gland, with associated narrowing of the subglottic trachea; this is grossly  stable from 2011. 4.  Small to moderate hiatal hernia seen.   Original Report Authenticated By: Tonia Ghent, M.D.    Ct Abdomen Pelvis W Contrast  09/04/2012  *RADIOLOGY REPORT*  Clinical Data:  Abdominal pain and distension CT ABDOMEN AND PELVIS WITH CONTRAST  Technique:  Multidetector CT imaging of the abdomen and pelvis was performed following the standard protocol during bolus administration of intravenous contrast.  Oral contrast was also administered.  Contrast: OMNIPAQUE IOHEXOL 300 MG/ML  SOLN  Comparison: None.  Findings:  The lung bases are clear.  There is a focal hiatal hernia.  There is fatty change in the liver.  No focal liver lesions are identified.  There is no biliary duct dilatation.  Spleen, pancreas, adrenals appear normal. There is a small accessory spleen in the left upper quadrant.  Flank kidneys bilaterally appear normal.  In the pelvis, there is artifact from a total hip prosthesis. There is no pelvic mass or fluid collection.  There is no bowel obstruction.  No free air or portal venous air. There is no periappendiceal region inflammation.  There is some fold thickening in the distal stomach and proximal duodenum.  There is also some mild fold thickening in the at ascending colon.  These areas may represent some localized duodenitis and proximal colitis.  The surrounding mesentery, however, is not thickened.  There is no ascites, adenopathy, or abscess in the abdomen or pelvis.  Aorta is nonaneurysmal.  There is atherosclerotic change in the aorta.  There are no blastic or lytic bone lesions.  There is degenerative change in the left hip joint and lumbar spine regions.   IMPRESSION: Evidence suggesting duodenitis and mild ascending colitis.  No abscess.  No mesenteric inflammation.  No diverticulitis.  Focal hiatal hernia.  Fatty liver.  Arthropathy in the left hip joint and lumbar spine.  Status post total right hip replacement.   Original Report Authenticated By: Bretta Bang, M.D.      Labs: Basic Metabolic Panel:  Lab 09/07/12 4540 09/06/12 0529 09/04/12 0520 09/03/12 0524 09/01/12 2330  NA 131* 134* 137 136 136  K 3.1* 3.0* 3.5 3.1* 3.2*  CL 93* 94* 98 100 98  CO2 30 33* 30 28 27   GLUCOSE 129* 115* 103* 101* 103*  BUN 4* 4* 8 10 9   CREATININE 0.54 0.59 0.57 0.57 0.54  CALCIUM 8.6 8.9 9.0 8.8 8.9  MG -- -- -- -- --  PHOS -- -- -- -- --   Liver Function Tests:  Lab 09/01/12 2330  AST 37  ALT 41*  ALKPHOS 53  BILITOT 0.3  PROT 6.3  ALBUMIN 3.3*   CBC:  Lab 09/07/12 0553 09/06/12 0529 09/04/12 0520 09/03/12 0524 09/01/12 2330  WBC 4.6 4.4 4.5 5.1 6.5  NEUTROABS -- -- -- -- 3.9  HGB 12.6 12.2 12.5 12.8 13.2  HCT 37.3 36.1 37.8 39.2 40.2  MCV 88.0 90.0 90.2 90.7 91.2  PLT 277 267 290 321 296   Cardiac Enzymes:  Lab 09/02/12 0248  CKTOTAL --  CKMB --  CKMBINDEX --  TROPONINI <0.30   BNP: BNP (last 3 results)  Basename 09/01/12 2330  PROBNP 49.8   CBG: No results found for this basename: GLUCAP:5 in the last 168 hours    Time coordinating discharge:60 min  Signed:  [name], MD [facility] 09/07/2012, 12:18 PM

## 2012-10-29 HISTORY — PX: HIP SURGERY: SHX245

## 2014-05-09 ENCOUNTER — Emergency Department (HOSPITAL_COMMUNITY)
Admission: EM | Admit: 2014-05-09 | Discharge: 2014-05-09 | Disposition: A | Discharge status: Home / Self Care | Payer: Self-pay | Attending: Emergency Medicine | Admitting: Emergency Medicine

## 2014-05-09 ENCOUNTER — Encounter (HOSPITAL_COMMUNITY): Payer: Self-pay | Admitting: Emergency Medicine

## 2014-05-09 ENCOUNTER — Emergency Department (HOSPITAL_COMMUNITY): Payer: Self-pay

## 2014-05-09 DIAGNOSIS — Z792 Long term (current) use of antibiotics: Secondary | ICD-10-CM | POA: Insufficient documentation

## 2014-05-09 DIAGNOSIS — E669 Obesity, unspecified: Secondary | ICD-10-CM | POA: Insufficient documentation

## 2014-05-09 DIAGNOSIS — E049 Nontoxic goiter, unspecified: Secondary | ICD-10-CM | POA: Insufficient documentation

## 2014-05-09 DIAGNOSIS — Z79899 Other long term (current) drug therapy: Secondary | ICD-10-CM | POA: Insufficient documentation

## 2014-05-09 DIAGNOSIS — K219 Gastro-esophageal reflux disease without esophagitis: Secondary | ICD-10-CM | POA: Insufficient documentation

## 2014-05-09 DIAGNOSIS — I10 Essential (primary) hypertension: Secondary | ICD-10-CM | POA: Insufficient documentation

## 2014-05-09 DIAGNOSIS — M1990 Unspecified osteoarthritis, unspecified site: Secondary | ICD-10-CM | POA: Insufficient documentation

## 2014-05-09 DIAGNOSIS — E010 Iodine-deficiency related diffuse (endemic) goiter: Secondary | ICD-10-CM

## 2014-05-09 DIAGNOSIS — Z791 Long term (current) use of non-steroidal anti-inflammatories (NSAID): Secondary | ICD-10-CM | POA: Insufficient documentation

## 2014-05-09 LAB — BASIC METABOLIC PANEL
Anion gap: 14 (ref 5–15)
BUN: 14 mg/dL (ref 6–23)
CALCIUM: 8.9 mg/dL (ref 8.4–10.5)
CO2: 25 mEq/L (ref 19–32)
CREATININE: 0.64 mg/dL (ref 0.50–1.10)
Chloride: 100 mEq/L (ref 96–112)
GFR calc Af Amer: >90 mL/min (ref >90)
GLUCOSE: 110 mg/dL — AB (ref 70–99)
Potassium: 3.9 mEq/L (ref 3.7–5.3)
Sodium: 139 mEq/L (ref 137–147)

## 2014-05-09 LAB — CBC
HEMATOCRIT: 41.7 % (ref 36.0–46.0)
Hemoglobin: 13.3 g/dL (ref 12.0–15.0)
MCH: 28.9 pg (ref 26.0–34.0)
MCHC: 31.9 g/dL (ref 30.0–36.0)
MCV: 90.5 fL (ref 78.0–100.0)
Platelets: 304 10*3/uL (ref 150–400)
RBC: 4.61 MIL/uL (ref 3.87–5.11)
RDW: 15.1 % (ref 11.5–15.5)
WBC: 9.3 10*3/uL (ref 4.0–10.5)

## 2014-05-09 LAB — I-STAT TROPONIN, ED: Troponin i, poc: 0 ng/mL (ref 0.00–0.08)

## 2014-05-09 LAB — D-DIMER, QUANTITATIVE (NOT AT ARMC): D DIMER QUANT: 0.48 ug{FEU}/mL (ref 0.00–0.48)

## 2014-05-09 LAB — PRO B NATRIURETIC PEPTIDE: Pro B Natriuretic peptide (BNP): 151.4 pg/mL — ABNORMAL HIGH (ref 0–125)

## 2014-05-09 MED ORDER — IPRATROPIUM BROMIDE 0.02 % IN SOLN
0.5000 mg | Freq: Once | RESPIRATORY_TRACT | Status: AC
  Administered 2014-05-09: 0.5 mg via RESPIRATORY_TRACT
  Filled 2014-05-09: qty 2.5

## 2014-05-09 MED ORDER — ALBUTEROL SULFATE (2.5 MG/3ML) 0.083% IN NEBU
5.0000 mg | INHALATION_SOLUTION | Freq: Once | RESPIRATORY_TRACT | Status: AC
  Administered 2014-05-09: 5 mg via RESPIRATORY_TRACT
  Filled 2014-05-09: qty 6

## 2014-05-09 MED ORDER — HYDROCODONE-ACETAMINOPHEN 5-325 MG PO TABS
1.0000 | ORAL_TABLET | Freq: Once | ORAL | Status: AC
  Administered 2014-05-09: 1 via ORAL
  Filled 2014-05-09: qty 1

## 2014-05-09 NOTE — ED Notes (Signed)
Pt states that she has been having neck, shoulder and upper back swelling that makes her feel as if she cannot breathe.  Pt is breathing calmly in triage and is in NAD.  Pt also states that she has been having frequency of urination w/o dysuria.  Now c/o chest pain states that she can hear herself wheeze.

## 2014-05-09 NOTE — ED Notes (Signed)
Per patient and family-has had large swollen, soft areas on anterior and posterior neck and shoulders. Areas are soft to palpate without tenderness. Per son "these places come and go." Pt also reports "my throat feels tight and I have pain when I try to breathe in." Anterior neck and chest noted to be red with no hives. Uses albuterol inhaler. Has not taken any medications. No precipitating factors. Awaiting MD.

## 2014-05-09 NOTE — Discharge Instructions (Signed)
Bocio  (Goiter) Se llama bocio al agrandamiento de la glndula tiroides. La glndula tiroides se encuentra en la parte frontal de la base del cuello. La glndula produce hormonas que regulan el estado de nimo, la Arts development officer, la frecuencia del pulso, y la digestin. La mayora de los casos de bocio no causan dolor y no son motivo de gran preocupacin. A veces es necesario tratar el bocio y las afecciones que lo causan.  CAUSAS  Las causas comunes son:   Tyron Russell de [place] (hace que se produzca gran cantidad de hormona [hipertiroidismo]).  Enfermedad de Hashimoto (hace que se produzca muy poca cantidad de hormona [hipotiroidismo]).  Tiroiditis (inflamacin de la tiroides a veces causada por un virus o Academic librarian).  Bocio nodular (se forman pequeas protuberancias, tambin llamado bocio nodular txico).  Embarazo.  Cncer de tiroides (muy pocos ndulos son cancerosos).  Ciertos medicamentos  Exposicin a la radiacin.  Deficiencia de yodo (ms comn en los pases en desarrollo en las poblaciones del interior). FACTORES DE RIESGO  Los factores de riesgo son:   Scheryl Darter familiar de bocio.  Pertenecer al gnero femenino.  Ingesta inadecuada de iodo en la dieta.  Tener ms de 40 aos. SNTOMAS  En muchos casos no hay sntomas. Si se presentan sntomas, stos pueden ser:   Hinchazn en la zona inferior del cuello. Esta hinchazn puede variar desde un pequeo bulto a una masa grande.  Sensacin de opresin en la garganta.  Voz ronca. Con menos frecuencia puede causar:   Tos.  Sibilancias.  Dificultad para tragar.  Dificultad para respirar.  Hinchazn de las venas del cuello.  Mareos. Cuando el bocio es consecuencia del hipertiroidismo, los sntomas pueden ser:   Latidos cardacos rpidos o irregulares.  Malestar en el estmago (nuseas).  Vmitos.  Diarrea.  Temblores.  Irritabilidad.  Ojos saltones.  Prdida de peso.  Sensibilidad  al calor.  Ansiedad. Cuando el bocio es consecuencia del hipotiroidismo, los sntomas pueden ser:   Cansancio.  Piel seca.  Constipacin.  Aumento de [place].  Perodos menstruales irregulares.  Depresin.  Sensibilidad al fro. DIAGNSTICO  Las pruebas para diagnosticar el bocio son:   Examen fsico.  Anlisis de sangre, que incluyan la medicin de los niveles de hormona tiroidea y anticuerpos.  Ecografa, tomografa computada o resonancia magntica (IRM).  Gammagrafa de tiroides (imgenes que se toman inyectando una sustancia radiactiva segura).  Muestra de tejido (biopsia) de los ndulos. La biopsia se hace para descartar que los ndulos no sean cancerosos. TRATAMIENTO  El tratamiento depender de la causa. El tratamiento puede incluir:  Controles. En algunos casos, no se necesita tratamiento y Administrator, sports los sntomas peridicamente.  Medicamentos y suplementos. Hay medicamentos para la tiroides (tratamiento con hormona tiroidea) disponibles para el hipertiroidismo e hipotiroidismo.  Si la inflamacin es la causa, se pueden recomendar medicamentos de venta libre o medicamentos corticoides.  El bocio causado   por la deficiencia de yodo puede tratarse con suplementos de yodo o cambios en la dieta.  Tratamiento con yodo radiactivo. El yodo radiactivo se inyecta en la sangre. Cuando llega a la glndula tiroides, destruye sus clulas y reduce el tamao de la glndula. Slo se utiliza cuando la glndula tiroides es hiperactiva. Despus de este tratamiento con frecuencia es necesario utilizar de [place] permanente medicamentos con hormona tiroidea.  [place]. El procedimiento para extirpar la totalidad o parte de la glndula se indica para casos graves o cuando el cncer es la causa. Se pueden tomar hormonas para  reemplazar las producidas normalmente por la glndula tiroides. INSTRUCCIONES PARA EL CUIDADO EN EL HOGAR   Tome los medicamentos como se le indic.  Siga  las recomendaciones de su mdico sobre los cambios en la dieta.  Concurra a los controles con el mdico para Patent attorneyrealizar estudios, como se Armed forces logistics/support/administrative officerle indica. PREVENCIN   Si tiene antecedentes familiares de bocio, hable con su mdico acerca de Goldman Sachshacerse estudios.  Asegrese de que est recibiendo la cantidad suficiente de yodo en la dieta.  El uso de la sal de mesa yodada puede ayudar a prevenir la deficiencia de yodo. Document Released: 07/09/2012 [facility] Patient Information 2015 [place], [place]. This information is not intended to replace advice given to you by your health care provider. Make sure you discuss any questions you have with your health care provider.

## 2014-05-09 NOTE — ED Provider Notes (Signed)
CSN: [id number]     Arrival date & time 05/09/14  1739 History   First MD Initiated Contact with Patient 05/09/14 1834     Chief Complaint  Patient presents with  . Facial Swelling   Family translated during evaluation HPI Patient presents to the emergency room with complaints of shortness of breath that started about 3 days ago. At the same time the patient noticed that she was having swelling in the upper part of her chest, upper back and around her neck. Patient feels as if this is causing some restriction of her airway.  She also has had some chronic issues with swelling in her lower legs. She does have history of varicose veins. She also has some discomfort in her chest and has heard some wheezing when she tries to breathe. Patient denies any history of prior lung problems. She denies any history of heart disease. She does have history of varicose veins in her leg. No history of PE or DVT.  yPast Medical History  Diagnosis Date  . Hypothyroidism   . Hypertension   . Varicose vein of leg   . GERD (gastroesophageal reflux disease)   . Osteoarthritis    Past Surgical History  Procedure Laterality Date  . Hip surgery  2008-9    right   Family History  Problem Relation Age of Onset  . High blood pressure     History  Substance Use Topics  . Smoking status: Never Smoker   . Smokeless tobacco: Never Used  . Alcohol Use: No   OB History   Grav Para Term Preterm Abortions TAB SAB Ect Mult Living                 Review of Systems  All other systems reviewed and are negative.     Allergies  Review of patient's allergies indicates no known allergies.  Home Medications   Prior to Admission medications   Medication Sig Start Date End Date Taking? Authorizing Provider  diphenhydrAMINE (BENADRYL) 25 mg capsule Take 25 mg by mouth every 6 (six) hours as needed. Itching    Historical Provider, MD  doxycycline (VIBRA-TABS) 100 MG tablet Take 1 tablet (100 mg total) by mouth 2  (two) times daily. 09/07/12   Alinda Money, MD  esomeprazole (NEXIUM) 40 MG capsule Take 1 capsule (40 mg total) by mouth daily before breakfast. 09/07/12   Alinda Money, MD  furosemide (LASIX) 20 MG tablet Take 1 tablet (20 mg total) by mouth daily as needed (leg swelling). 09/07/12   Alinda Money, MD  metoprolol (LOPRESSOR) 12.5 mg TABS Take 2 tablets (50 mg total) by mouth 2 (two) times daily. 09/07/12   Novlet Adelina Mings, MD  naproxen (NAPROSYN) 500 MG tablet Take 500 mg by mouth 2 (two) times daily with a meal.    Historical Provider, MD  oxycodone (OXY-IR) 5 MG capsule Take 5 mg by mouth every 4 (four) hours as needed. For pain    Historical Provider, MD  potassium chloride SA (K-DUR,KLOR-CON) 20 MEQ tablet Take 1 tablet (20 mEq total) by mouth daily as needed. 09/07/12   Novlet Adelina Mings, MD   BP 167/82  Pulse 77  Temp(Src) 98.3 F (36.8 C) (Oral)  Resp 25  SpO2 99% Physical Exam  Nursing note and vitals reviewed. Constitutional: She appears well-developed and well-nourished. No distress.  Obese   HENT:  Head: Normocephalic and atraumatic.  Right Ear: External ear normal.  Left Ear: External ear normal.  Eyes: Conjunctivae are normal. Right eye exhibits no discharge. Left eye exhibits no discharge. No scleral icterus.  Neck: Neck supple. No tracheal deviation present.  Cardiovascular: Normal rate, regular rhythm and intact distal pulses.   Pulmonary/Chest: Effort normal and breath sounds normal. No stridor. No respiratory distress. She has no wheezes. She has no rales.  No stridor, occsnl wheeze on expiration  Abdominal: Soft. Bowel sounds are normal. She exhibits no distension. There is no tenderness. There is no rebound and no guarding.  Musculoskeletal: She exhibits no edema and no tenderness.  Mild edema bilateral lower extremities  Neurological: She is alert. She has normal strength. No cranial nerve deficit (no facial droop, extraocular movements intact, no slurred  speech) or sensory deficit. She exhibits normal muscle tone. She displays no seizure activity. Coordination normal.  Skin: Skin is warm and dry. No rash noted. She is not diaphoretic.  Psychiatric: She has a normal mood and affect.    ED Course  Procedures (including critical care time) Labs Review Labs Reviewed  BASIC METABOLIC PANEL - Abnormal; Notable for the following:    Glucose, Bld 110 (*)    All other components within normal limits  PRO B NATRIURETIC PEPTIDE - Abnormal; Notable for the following:    Pro B Natriuretic peptide (BNP) 151.4 (*)    All other components within normal limits  CBC  D-DIMER, QUANTITATIVE  TSH  T4, FREE  I-STAT TROPOININ, ED  Rosezena SensorI-STAT TROPOININ, ED    Imaging Review Dg Chest 2 View  05/09/2014   CLINICAL DATA:  Respiratory distress.  EXAM: CHEST  2 VIEW  COMPARISON:  CT chest 09/02/2012.  PA and lateral chest 09/01/2012.  FINDINGS: There is cardiomegaly without edema. Lungs are clear. The trachea appears mildly narrowed due to marked enlargement of the thyroid gland but the appearance is unchanged. No pneumothorax or pleural fluid.  IMPRESSION: No acute finding.  Stable compared to prior exams.   Electronically Signed   By: Drusilla Kannerhomas  Dalessio M.D.   On: 05/09/2014 19:40     EKG Interpretation   Date/Time:  Sunday May 09 2014 18:08:26 EDT Ventricular Rate:  69 PR Interval:  175 QRS Duration: 94 QT Interval:  546 QTC Calculation: 585 R Axis:   -39 Text Interpretation:  Sinus rhythm Probable left atrial enlargement Left  axis deviation Abnormal R-wave progression, late transition Nonspecific T  abnrm, anterolateral leads Prolonged QT interval Baseline wander in  lead(s) I No significant change since last tracing Confirmed by Khyrie Masi   MD-J, Sherria Riemann ([id number]) on 05/09/2014 6:35:02 PM      MDM   Final diagnoses:  Thyromegaly    Pt CXR shows an enlarged thyroid.  I believe this is the cause of her complaints.   She does not have a PCP.  I stressed  the importance of outpatient follow up.  She may need thryoid ultrasound, biopsy etc.  Family is here at the bedside and understand the need for follow up as well.  THryoid panel sent.  Pending at this time.    Linwood DibblesJon Valera Vallas, MD 05/09/14 2131

## 2014-05-09 NOTE — ED Notes (Signed)
Patient and family educated on follow up for goiter. Acknowledged understanding of AVS. Pt ambulatory with steady gait with cane.

## 2014-05-10 LAB — T4, FREE: FREE T4: 1.4 ng/dL (ref 0.80–1.80)

## 2014-05-10 LAB — TSH: TSH: 0.401 u[IU]/mL (ref 0.350–4.500)

## 2014-06-02 ENCOUNTER — Telehealth: Payer: Self-pay | Admitting: Family

## 2014-06-02 ENCOUNTER — Ambulatory Visit (HOSPITAL_BASED_OUTPATIENT_CLINIC_OR_DEPARTMENT_OTHER)
Admission: RE | Admit: 2014-06-02 | Discharge: 2014-06-02 | Disposition: A | Discharge status: Home / Self Care | Payer: Self-pay | Source: Ambulatory Visit | Attending: Family | Admitting: Family

## 2014-06-02 ENCOUNTER — Ambulatory Visit (INDEPENDENT_AMBULATORY_CARE_PROVIDER_SITE_OTHER): Payer: Self-pay | Admitting: Family

## 2014-06-02 ENCOUNTER — Encounter: Payer: Self-pay | Admitting: Family

## 2014-06-02 VITALS — BP 144/84 | HR 76 | Temp 97.6°F | Resp 18 | Ht 64.0 in | Wt 265.1 lb

## 2014-06-02 DIAGNOSIS — E042 Nontoxic multinodular goiter: Secondary | ICD-10-CM

## 2014-06-02 DIAGNOSIS — E049 Nontoxic goiter, unspecified: Secondary | ICD-10-CM

## 2014-06-02 DIAGNOSIS — R350 Frequency of micturition: Secondary | ICD-10-CM

## 2014-06-02 DIAGNOSIS — R739 Hyperglycemia, unspecified: Secondary | ICD-10-CM

## 2014-06-02 DIAGNOSIS — K219 Gastro-esophageal reflux disease without esophagitis: Secondary | ICD-10-CM

## 2014-06-02 DIAGNOSIS — M160 Bilateral primary osteoarthritis of hip: Secondary | ICD-10-CM

## 2014-06-02 DIAGNOSIS — E010 Iodine-deficiency related diffuse (endemic) goiter: Secondary | ICD-10-CM

## 2014-06-02 DIAGNOSIS — R7309 Other abnormal glucose: Secondary | ICD-10-CM

## 2014-06-02 DIAGNOSIS — I517 Cardiomegaly: Secondary | ICD-10-CM

## 2014-06-02 DIAGNOSIS — M1610 Unilateral primary osteoarthritis, unspecified hip: Secondary | ICD-10-CM

## 2014-06-02 MED ORDER — METOPROLOL TARTRATE 50 MG PO TABS: ORAL_TABLET | ORAL | Status: DC

## 2014-06-02 NOTE — Telephone Encounter (Signed)
Please contact pt's son and let him know that her ultrasound shows enlargement and multiple nodules.  I will refer her to Endo for further evaluation.

## 2014-06-02 NOTE — Assessment & Plan Note (Signed)
On chronic oxy IR- rx per orthopedics.

## 2014-06-02 NOTE — Assessment & Plan Note (Signed)
Will obtain US of thyroid.  After review of US, will plan referral to endocrinology for further evaluation.  Will likely need thyroidectomy due to tracheal compression noted on CXR.  This is likely cause for her SOB.  TFT's were normal. I did provide her with the [facility] business office to apply for assistance with her medical costs.

## 2014-06-02 NOTE — Assessment & Plan Note (Signed)
Stable on PPI, continue same.  

## 2014-06-02 NOTE — Assessment & Plan Note (Signed)
Noted in ER labs, + urinary frequency which may be a result of her hyperglycemia. Check A1C, UA to further evaluate.

## 2014-06-02 NOTE — Patient Instructions (Addendum)
Complete lab work prior to leaving.  Please schedule your neck ultrasound on the first floor. We will contact you with your results. Contact the [facility] to apply for patient assistance to help with your medical costs: [phone] Please schedule a complete physical at your convenience.  [facility]!

## 2014-06-02 NOTE — Progress Notes (Signed)
Pre visit review using our clinic review tool, if applicable. No additional management support is needed unless otherwise documented below in the visit note. 

## 2014-06-02 NOTE — Progress Notes (Signed)
Subjective:    Patient ID: Shannon [name], female    DOB: 1951-07-05, 63 y.o.   MRN: [id number]  HPI  [name] is a 63 yr old female who presents today to establish care. She presents today with her son who assists in translation as she only speaks spanish. She is unfortunately without insurance. She has several concerns:  1) Thyromegaly- per pt she was unaware of the thyroid enlargement.    2) SOB- she presented to the ED on 05/09/14 with chief complaint of shortness of breath.  ED records are reviewed.  BNP was 151, cxr showed clear lungs, cardiomegaly, and mildly narrowed trachea due to marked enlargement of the thyroid.  D Dimer was normal.  TSH and free T4 were normal.  Glucose was mildly elevated. She reports that SOB started 3 months ago. Initially told that this was due to bronchitis. Reports SOB is intermittent.    3) Mass on back- she is concerned about this mass.  Has been present for some time.  Her other past medical history is significant for the following:  HTN- she is maintained on metoprolol.  She is taking 1/4 of a 50mg  metoprolol once daily.  BP Readings from Last 3 Encounters:  06/02/14 144/84  05/09/14 119/66  09/07/12 105/62  She did not take the med today.  She denies CP.  She denies LE edema.  GERD- she takes omeprazole 20mg  once daily.  OA- reports that she is s/p bilateral THA, on daily oxy IR for chronic pain. This is prescribed by Dr. Madelon Lipsaffrey, her orthopedic MD.     Review of Systems  Constitutional: Negative for unexpected weight change.  Respiratory: Positive for shortness of breath.   Cardiovascular: Negative for chest pain.  Gastrointestinal: Positive for constipation. Negative for nausea and vomiting.  Genitourinary: Positive for frequency. Negative for dysuria.  Musculoskeletal: Positive for arthralgias.       Past Medical History  Diagnosis Date  . Hypothyroidism   . Hypertension   . Varicose vein of leg   . GERD  (gastroesophageal reflux disease)   . Osteoarthritis     History   Social History  . Marital Status: Widowed    Spouse Name: N/A    Number of Children: N/A  . Years of Education: N/A   Occupational History  . Not on file.   Social History Main Topics  . Smoking status: Never Smoker   . Smokeless tobacco: Never Used  . Alcohol Use: No  . Drug Use: No  . Sexual Activity: No   Other Topics Concern  . Not on file   Social History Narrative   Ten children- 4 liven in [place], 6 in [place].  13 grandchildren, 5 great grand children   From [place]   She has lived here for 17 years   She has not worked recently due to hip problems.  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 Hip surgery  2008-9    right  . Hip surgery  2014    left    Family History  Problem Relation Age of Onset  . High blood pressure      No Known Allergies  Current Outpatient Prescriptions on File Prior to Visit  Medication Sig Dispense Refill  . oxycodone (OXY-IR) 5 MG capsule  Take 5 mg by mouth every 4 (four) hours as needed. For pain       No current facility-administered medications on file prior to visit.    BP 144/84  Pulse 76  Temp(Src) 97.6 F (36.4 C) (Oral)  Resp 18  Ht 5\' 4"  (1.626 m)  Wt 265 lb 1.9 oz (120.258 kg)  BMI 45.49 kg/m2  SpO2 97%    Objective:   Physical Exam  Constitutional: She is oriented to person, place, and time.  Pleasant, morbidly obese hispanic female in NAD  HENT:  Head: Normocephalic and atraumatic.  Neck:  Neck exam is limited by overlying adipose tissue.  I am not able to discretely palpate thryoid.  Cardiovascular: Normal rate and regular rhythm.   No murmur heard. Pulmonary/Chest: Effort normal and breath sounds normal. No respiratory distress. She has no wheezes. She has no rales. She exhibits no  tenderness.  Musculoskeletal:  Soft fat pads noted at base of posterior c spine, bilateral neck base above clavicle.   Neurological: She is alert and oriented to person, place, and time.  Psychiatric: She has a normal mood and affect. Her behavior is normal. Judgment and thought content normal.          Assessment & Plan:

## 2014-06-04 NOTE — Telephone Encounter (Signed)
Left message for pts son to return my call.

## 2014-06-07 NOTE — Telephone Encounter (Addendum)
Also, can you remind pt that she needs A1C and urine?

## 2014-06-08 NOTE — Telephone Encounter (Signed)
Left message to return my call.  

## 2014-06-08 NOTE — Telephone Encounter (Signed)
Notified pt's family re: below instructions and he voices understanding. He states he will bring pt in for remaining labs tomorrow.

## 2014-06-09 LAB — HEMOGLOBIN A1C
Hgb A1c MFr Bld: 5.9 % — ABNORMAL HIGH (ref <5.7)
MEAN PLASMA GLUCOSE: 123 mg/dL — AB (ref <117)

## 2014-06-10 ENCOUNTER — Telehealth: Payer: Self-pay | Admitting: Family

## 2014-06-10 ENCOUNTER — Encounter: Payer: Self-pay | Admitting: Family

## 2014-06-10 LAB — URINALYSIS W MICROSCOPIC + REFLEX CULTURE
Bilirubin Urine: NEGATIVE
CASTS: NONE SEEN
Crystals: NONE SEEN
Glucose, UA: NEGATIVE mg/dL
HGB URINE DIPSTICK: NEGATIVE
KETONES UR: NEGATIVE mg/dL
Nitrite: NEGATIVE
PROTEIN: NEGATIVE mg/dL
Specific Gravity, Urine: 1.016 (ref 1.005–1.030)
Urobilinogen, UA: 0.2 mg/dL (ref 0.0–1.0)
pH: 8 (ref 5.0–8.0)

## 2014-06-10 MED ORDER — CIPROFLOXACIN HCL 250 MG PO TABS: 250.0000 mg | ORAL_TABLET | Freq: Two times a day (BID) | ORAL | Status: DC

## 2014-06-10 NOTE — Telephone Encounter (Signed)
Lab work shows mild increased sugar showing that you are increased risk for diabetes.  This can be helped by avoiding concentrated sweets/white fluffy carbs and exercising regularly. Urine shows possible mild uti, rx sent for cipro.

## 2014-06-11 ENCOUNTER — Encounter: Payer: Self-pay | Admitting: Internal Medicine

## 2014-06-11 NOTE — Telephone Encounter (Signed)
Unable to reach pt at home #, unable to leave message. Notified pt's niece.

## 2014-06-13 ENCOUNTER — Encounter: Payer: Self-pay | Admitting: Family

## 2014-06-13 LAB — URINE CULTURE

## 2014-06-30 ENCOUNTER — Telehealth: Payer: Self-pay | Admitting: Family

## 2014-06-30 ENCOUNTER — Encounter: Payer: Self-pay | Admitting: Family

## 2014-06-30 DIAGNOSIS — Z0289 Encounter for other administrative examinations: Secondary | ICD-10-CM

## 2014-06-30 NOTE — Telephone Encounter (Signed)
No show cpe °

## 2014-06-30 NOTE — Telephone Encounter (Signed)
Noted.  Please contact pt to reschedule.   

## 2014-07-01 ENCOUNTER — Encounter: Payer: Self-pay | Admitting: Family

## 2014-07-01 NOTE — Telephone Encounter (Signed)
Could not leave message. Will send letter

## 2014-07-06 ENCOUNTER — Ambulatory Visit (INDEPENDENT_AMBULATORY_CARE_PROVIDER_SITE_OTHER): Payer: Self-pay | Admitting: Internal Medicine

## 2014-07-06 ENCOUNTER — Encounter: Payer: Self-pay | Admitting: Internal Medicine

## 2014-07-06 VITALS — BP 140/80 | HR 78 | Temp 98.3°F | Resp 16 | Ht 63.0 in | Wt 268.0 lb

## 2014-07-06 DIAGNOSIS — E049 Nontoxic goiter, unspecified: Secondary | ICD-10-CM

## 2014-07-06 DIAGNOSIS — E010 Iodine-deficiency related diffuse (endemic) goiter: Secondary | ICD-10-CM

## 2014-07-06 NOTE — Patient Instructions (Addendum)
Please return in 6 months for another visit.

## 2014-07-06 NOTE — Progress Notes (Signed)
Patient ID: Shannon [name], female   DOB: [birth year], 63 y.o.   MRN: [id number]   HPI  Shannon [name] is a 63 y.o.-year-old female, referred by her PCP, Lemont Fillers., NP, for evaluation for MNG.  Pt started to hear SOB, wheezing, coughing >> went to ED on 05/09/2014 >> CXR showed: The trachea appears mildly narrowed due to marked enlargement of the thyroid gland but the appearance is unchanged.  Thyroid U/S(06/02/2014):  Large MNG, but nodules not well defined to necessitate Bx.  I reviewed pt's thyroid tests: Lab Results  Component Value Date   TSH 0.401 05/09/2014   TSH 0.426 09/02/2012   FREET4 1.40 05/09/2014    Pt denies feeling nodules in neck, no hoarseness, no dysphagia/odynophagia, no SOB with lying down. She is, however, bothered by the wheezing, and sometimes has cough.  Pt denies: - heat intolerance/cold intolerance - tremors - palpitations - anxiety/depression - hyperdefecation/constipation - weight loss - weight gain - dry skin - hair falling - fatigue  Pt does have a FH of thyroid ds. No FH of thyroid cancer. No h/o radiation tx to head or neck.  No seaweed or kelp, no recent contrast studies. No recent steroid use - but had steroid inj before hip replacement 1 year ago. No herbal supplements.   ROS: Constitutional: no weight gain/loss, no fatigue, no subjective hyperthermia/hypothermia Eyes: no blurry vision, no xerophthalmia ENT: + sore throat, no nodules palpated in throat, no dysphagia/odynophagia, no hoarseness Cardiovascular: no CP/SOB/palpitations/leg swelling Respiratory: + cough/no SOB/+ wheezing Gastrointestinal: no N/V/D/C Musculoskeletal: no muscle/joint aches Skin: no rashes Neurological: no tremors/numbness/tingling/dizziness, + HA Psychiatric: + depression/no anxiety  Past Medical History  Diagnosis Date  . Hypothyroidism   . Hypertension   . Varicose vein of leg   . GERD (gastroesophageal reflux disease)   .  Osteoarthritis    Past Surgical History  Procedure Laterality Date  . Hip surgery  2008-9    right  . Hip surgery  2014    left   History   Social History Main Topics  . Smoking status: Never Smoker   . Smokeless tobacco: Never Used  . Alcohol Use: No  . Drug Use: No   Social History Narrative   10 children- 4 liven in [place], 6 in [place].  13 grandchildren, 5 great grand children   From [place]   She has lived here for 17 years   She has not worked recently due to hip problems.  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 ciprofloxacin (CIPRO) 250 MG tablet Take 1 tablet (250 mg total) by mouth 2 (two) times daily.  6 tablet  0  . metoprolol (LOPRESSOR) 50 MG tablet Take 1/4 tablet by mouth once daily.  30 tablet  2  . omeprazole (PRILOSEC OTC) 20 MG tablet Take 20 mg by mouth daily.      Marland Kitchen oxycodone (OXY-IR) 5 MG capsule Take 5 mg by mouth every 4 (four) hours as needed. For pain       No current facility-administered medications on file prior to visit.   No Known Allergies Family History  Problem Relation Age of Onset  . High blood pressure     PE: BP 140/80  Pulse 78  Temp(Src) 98.3 F (36.8 C) (Oral)  Resp 16  Ht  (1.6 m)  Wt 268 lb (121.564 kg)  BMI 47.49 kg/m2  SpO2 95% Wt Readings from Last 3 Encounters:  07/06/14 268 lb (121.564 kg)  06/02/14 265 lb 1.9 oz (120.258 kg)  09/02/12 272 lb 1.6 oz (123.424 kg)   Constitutional: overweight, in NAD, full supraclavicular fat pads Eyes: PERRLA, EOMI, no exophthalmos ENT: moist mucous membranes, + thyromegaly, no cervical lymphadenopathy Cardiovascular: RRR, No MRG Respiratory: CTA B Gastrointestinal: abdomen soft, NT, ND, BS+ Musculoskeletal: no deformities, strength intact in all 4;  Skin: moist, warm, no rashes Neurological: no tremor  with outstretched hands, DTR normal in all 4  ASSESSMENT: 1. MNG Thyroid U/S(06/02/2014):   Right thyroid lobe Measurements: 7.6 x 3.3 x 2.8 cm.   Left thyroid lobe Measurements: 10.5 x 3.9 x 4.9 cm.   Isthmus Thickness: 1.1 cm.   The entire thyroid gland is significantly enlarged and demonstrates  multiple ill-defined areas of nodularity and overall very  heterogeneous echotexture. Several areas were measured by the  sonographer but do not appear to represent discrete nodules.  Instead, the entire gland is nodular. The only discrete nodule is a  hypoechoic nodule in the superior left lobe measuring 1.3 x 1.8 x  1.3 cm containing areas of shadowing central calcification.   Lymphadenopathy: None visualized.   PLAN: 1. MNG  - I reviewed the images of her thyroid ultrasound along with the patient, her son and her cousin. I pointed out that the thyroid is large and, per the CXR, it does impinge on her trachea. I believe this is the cause of her wheezing/cough. No dysphagia, hoarseness or choking  - we discussed about the fact that she may need to have surgery if the goiter is compressing her trachea  - I suggested to check a flow-loop curve to check for compression >> this was done in the office >> mild airway obstruction. I will also discuss with pulmonary about the test result - pt agrees to have the surgery if really needed - we discussed about the fact that she will need to take thyroid hormones if she ends up having the surgery. She agrees with this  Contact #: (cousin) Reuel Boom (English-speaking) [phone]  I d/w Dr Maple Hudson (Pulmonary) who reviewed pt's spirometry and her imaging tests. He agrees that a referral to surgery for thyroidectomy to avoid further Th narrowing is indicated. Will d/w pt.   Pt agrees with referral to surgery.

## 2014-07-14 ENCOUNTER — Telehealth: Payer: Self-pay | Admitting: Internal Medicine

## 2014-07-14 NOTE — Telephone Encounter (Signed)
Called and spoke with pt's grandaughter, Aram Beecham. She said that the pt said it is ok to go ahead with the referral to the surgeon. Please be sure when scheduling the appt. They contact Aram Beecham at [phone]. Be advised.

## 2014-07-14 NOTE — Telephone Encounter (Signed)
Yes, she was going to discuss with pt to see if she agrees with referral to surgery for her goiter (which I suggested). Does the pt agree with the referral?

## 2014-07-14 NOTE — Telephone Encounter (Signed)
Please read note below

## 2014-07-14 NOTE — Telephone Encounter (Signed)
Patients granddaughter called stating that Dr. Elvera Lennox called her   Please advise this    Thank you

## 2014-07-14 NOTE — Telephone Encounter (Signed)
Patient granddaughter called stated patient didn't understand your phone message.  Call and ask for Shannon [name] [phone]

## 2014-07-15 NOTE — Telephone Encounter (Signed)
Referral placed - Dr Gerrit Friends. Their office will call Aram Beecham.

## 2014-08-31 ENCOUNTER — Telehealth: Payer: Self-pay | Admitting: Endocrinology

## 2014-08-31 ENCOUNTER — Telehealth: Payer: Self-pay | Admitting: Internal Medicine

## 2014-08-31 NOTE — Telephone Encounter (Signed)
Received 3 pages from [facility], sent to Dr. Everardo AllEllison. 08/31/14/ss

## 2014-08-31 NOTE — Telephone Encounter (Signed)
Received 2 pages from [facility], sent to Dr. Elvera LennoxGherghe. 08/31/14/ss

## 2014-11-05 NOTE — Progress Notes (Signed)
Please put orders in Epic surgery 11-18-14 pre op 11-02-14 Thanks

## 2014-11-08 ENCOUNTER — Ambulatory Visit (INDEPENDENT_AMBULATORY_CARE_PROVIDER_SITE_OTHER): Payer: Self-pay | Admitting: Surgery

## 2014-11-10 NOTE — Patient Instructions (Addendum)
Shannon [name]  11/10/2014   Your procedure is scheduled on:11/18/14   Report to [facility]  Entrance and follow signs to               Short Stay Center at 5:30 AM.   Call this number if you have problems the morning of surgery [phone]   Remember:  Do not eat food or drink liquids :After Midnight.     Take these medicines the morning of surgery with A SIP OF WATER: METOPROLOL            STOP ASPIRIN / HERBAL MEDS / VITAMINS 5 DAYS PREOP                               You may not have any metal on your body including hair pins and              piercings  Do not wear jewelry, make-up, lotions, powders or perfumes.             Do not wear nail polish.  Do not shave  48 hours prior to surgery.              Men may shave face and neck.   Do not bring valuables to the hospital. [place] IS NOT             RESPONSIBLE   FOR VALUABLES.  Contacts, dentures or bridgework may not be worn into surgery.  Leave suitcase in the car. After surgery it may be brought to your room.     Patients discharged the day of surgery will not be allowed to drive home.  Name and phone number of your driver:  Special Instructions: N/A              Please read over the following fact sheets you were given: _____________________________________________________________________                                                      - PREPARING FOR SURGERY  Before surgery, you can play an important role.  Because skin is not sterile, your skin needs to be as free of germs as possible.  You can reduce the number of germs on your skin by washing with CHG (chlorahexidine gluconate) soap before surgery.  CHG is an antiseptic cleaner which kills germs and bonds with the skin to continue killing germs even after washing. Please DO NOT use if you have an allergy to CHG or antibacterial soaps.  If your skin becomes reddened/irritated stop using the CHG and inform  your nurse when you arrive at Short Stay. Do not shave (including legs and underarms) for at least 48 hours prior to the first CHG shower.  You may shave your face. Please follow these instructions carefully:   1.  Shower with CHG Soap the night before surgery and the  morning of Surgery.   2.  If you choose to wash your hair, wash your hair first as usual with your  normal  Shampoo.   3.  After you shampoo, rinse your hair and body thoroughly to remove the  shampoo.  4.  Use CHG as you would any other liquid soap.  You can apply chg directly  to the skin and wash . Gently wash with scrungie or clean wascloth    5.  Apply the CHG Soap to your body ONLY FROM THE NECK DOWN.   Do not use on open                           Wound or open sores. Avoid contact with eyes, ears mouth and genitals (private parts).                        Genitals (private parts) with your normal soap.              6.  Wash thoroughly, paying special attention to the area where your surgery  will be performed.   7.  Thoroughly rinse your body with warm water from the neck down.   8.  DO NOT shower/wash with your normal soap after using and rinsing off  the CHG Soap .                9.  Pat yourself dry with a clean towel.             10.  Wear clean pajamas.             11.  Place clean sheets on your bed the night of your first shower and do not  sleep with pets.  Day of Surgery : Do not apply any lotions/deodorants the morning of surgery.  Please wear clean clothes to the hospital/surgery center.  FAILURE TO FOLLOW THESE INSTRUCTIONS MAY RESULT IN THE CANCELLATION OF YOUR SURGERY    PATIENT SIGNATURE_________________________________  ______________________________________________________________________

## 2014-11-12 ENCOUNTER — Encounter (HOSPITAL_COMMUNITY): Payer: Self-pay

## 2014-11-12 ENCOUNTER — Encounter (HOSPITAL_COMMUNITY)
Admission: RE | Admit: 2014-11-12 | Discharge: 2014-11-12 | Disposition: A | Discharge status: Home / Self Care | Payer: Self-pay | Source: Ambulatory Visit | Attending: Surgery | Admitting: Surgery

## 2014-11-12 ENCOUNTER — Ambulatory Visit (HOSPITAL_COMMUNITY)
Admission: RE | Admit: 2014-11-12 | Discharge: 2014-11-12 | Disposition: A | Discharge status: Home / Self Care | Payer: Self-pay | Source: Ambulatory Visit | Attending: Anesthesiology | Admitting: Anesthesiology

## 2014-11-12 DIAGNOSIS — E049 Nontoxic goiter, unspecified: Secondary | ICD-10-CM

## 2014-11-12 DIAGNOSIS — I10 Essential (primary) hypertension: Secondary | ICD-10-CM | POA: Insufficient documentation

## 2014-11-12 DIAGNOSIS — Z01818 Encounter for other preprocedural examination: Secondary | ICD-10-CM | POA: Insufficient documentation

## 2014-11-12 DIAGNOSIS — I517 Cardiomegaly: Secondary | ICD-10-CM | POA: Insufficient documentation

## 2014-11-12 DIAGNOSIS — J398 Other specified diseases of upper respiratory tract: Secondary | ICD-10-CM | POA: Insufficient documentation

## 2014-11-12 LAB — CBC
HCT: 44 % (ref 36.0–46.0)
HEMOGLOBIN: 13.8 g/dL (ref 12.0–15.0)
MCH: 28.3 pg (ref 26.0–34.0)
MCHC: 31.4 g/dL (ref 30.0–36.0)
MCV: 90.3 fL (ref 78.0–100.0)
PLATELETS: 350 10*3/uL (ref 150–400)
RBC: 4.87 MIL/uL (ref 3.87–5.11)
RDW: 14.8 % (ref 11.5–15.5)
WBC: 9.6 10*3/uL (ref 4.0–10.5)

## 2014-11-12 LAB — BASIC METABOLIC PANEL
ANION GAP: 10 (ref 5–15)
BUN: 17 mg/dL (ref 6–23)
CO2: 32 mmol/L (ref 19–32)
CREATININE: 0.66 mg/dL (ref 0.50–1.10)
Calcium: 9.4 mg/dL (ref 8.4–10.5)
Chloride: 98 mEq/L (ref 96–112)
GFR calc Af Amer: >90 mL/min (ref >90)
Glucose, Bld: 90 mg/dL (ref 70–99)
POTASSIUM: 3.8 mmol/L (ref 3.5–5.1)
SODIUM: 140 mmol/L (ref 135–145)

## 2014-11-12 NOTE — Progress Notes (Signed)
Quick Note:  Pre-operative chest x-ray is acceptable for scheduled surgery.  Bharat Antillon M. Jalasia Eskridge, MD, FACS [facility] Office: [phone]   ______ 

## 2014-11-12 NOTE — Progress Notes (Signed)
Quick Note:  These results are acceptable for scheduled surgery.  Marieme Mcmackin M. Caylen Yardley, MD, FACS [facility] Office: [phone]   ______ 

## 2014-11-12 NOTE — Progress Notes (Signed)
   11/12/14 1309  OBSTRUCTIVE SLEEP APNEA  Have you ever been diagnosed with sleep apnea through a sleep study? No  Do you snore loudly (loud enough to be heard through closed doors)?  0  Do you often feel tired, fatigued, or sleepy during the daytime? 1  Has anyone observed you stop breathing during your sleep? 0  Do you have, or are you being treated for high blood pressure? 1  BMI more than 35 kg/m2? 1  Age over 64 years old? 1  Gender: 0  Obstructive Sleep Apnea Score 4

## 2014-11-17 MED ORDER — DEXTROSE 5 % IV SOLN
3.0000 g | INTRAVENOUS | Status: AC
  Administered 2014-11-18: 3 g via INTRAVENOUS
  Filled 2014-11-17 (×2): qty 3000

## 2014-11-17 NOTE — Anesthesia Preprocedure Evaluation (Addendum)
Anesthesia Evaluation  Patient identified by MRN, date of birth, ID band Patient awake    Reviewed: Allergy & Precautions, NPO status , Patient's Chart, lab work & pertinent test results, reviewed documented beta blocker date and time   History of Anesthesia Complications (+) POST - OP SPINAL HEADACHE  Airway Mallampati: II   Neck ROM: Full    Dental  (+) Dental Advisory Given, Poor Dentition, Missing   Pulmonary neg pulmonary ROS,  CXR shows thyroid enlargement with some narrowing of trachea and displacement to right breath sounds clear to auscultation        Cardiovascular hypertension, Pt. on medications Rhythm:Regular  ECHO 2013 EF  65-70%   Neuro/Psych    GI/Hepatic Neg liver ROS, GERD-  Medicated,  Endo/Other    Renal/GU GFR 90     Musculoskeletal   Abdominal (+) + obese,   Peds  Hematology   Anesthesia Other Findings   Reproductive/Obstetrics                           Anesthesia Physical Anesthesia Plan  ASA: III  Anesthesia Plan: General   Post-op Pain Management:    Induction: Intravenous  Airway Management Planned: Oral ETT  Additional Equipment:   Intra-op Plan:   Post-operative Plan: Extubation in OR  Informed Consent: I have reviewed the patients History and Physical, chart, labs and discussed the procedure including the risks, benefits and alternatives for the proposed anesthesia with the patient or authorized representative who has indicated his/her understanding and acceptance.     Plan Discussed with:   Anesthesia Plan Comments: (Large thyroid by US and CXR with some tracheal compression and deviation to right.  Would plan on size 7 or smaller tube, Glide available, multimodal pain RX, recovery in 45 degree head-up position, will try to minimize narcotics 2nd morbid obesity)        Anesthesia Quick Evaluation

## 2014-11-18 ENCOUNTER — Encounter (HOSPITAL_COMMUNITY)
Admission: RE | Disposition: A | Discharge status: Home / Self Care | Payer: Self-pay | Source: Ambulatory Visit | Attending: Surgery

## 2014-11-18 ENCOUNTER — Encounter (HOSPITAL_COMMUNITY): Payer: Self-pay | Admitting: *Deleted

## 2014-11-18 ENCOUNTER — Ambulatory Visit (HOSPITAL_COMMUNITY): Payer: Self-pay | Admitting: Anesthesiology

## 2014-11-18 ENCOUNTER — Observation Stay (HOSPITAL_COMMUNITY)
Admission: RE | Admit: 2014-11-18 | Discharge: 2014-11-19 | Disposition: A | Discharge status: Home / Self Care | Payer: Self-pay | Source: Ambulatory Visit | Attending: Surgery | Admitting: Surgery

## 2014-11-18 DIAGNOSIS — I10 Essential (primary) hypertension: Secondary | ICD-10-CM | POA: Insufficient documentation

## 2014-11-18 DIAGNOSIS — K219 Gastro-esophageal reflux disease without esophagitis: Secondary | ICD-10-CM | POA: Insufficient documentation

## 2014-11-18 DIAGNOSIS — E042 Nontoxic multinodular goiter: Principal | ICD-10-CM | POA: Insufficient documentation

## 2014-11-18 DIAGNOSIS — E049 Nontoxic goiter, unspecified: Secondary | ICD-10-CM | POA: Diagnosis present

## 2014-11-18 DIAGNOSIS — Z6841 Body Mass Index (BMI) 40.0 and over, adult: Secondary | ICD-10-CM | POA: Insufficient documentation

## 2014-11-18 DIAGNOSIS — E010 Iodine-deficiency related diffuse (endemic) goiter: Secondary | ICD-10-CM

## 2014-11-18 DIAGNOSIS — E669 Obesity, unspecified: Secondary | ICD-10-CM | POA: Insufficient documentation

## 2014-11-18 HISTORY — PX: THYROIDECTOMY: SHX17

## 2014-11-18 SURGERY — THYROIDECTOMY
Anesthesia: General | Site: Neck

## 2014-11-18 MED ORDER — MEPERIDINE HCL 50 MG/ML IJ SOLN: 6.2500 mg | INTRAMUSCULAR | Status: DC | PRN

## 2014-11-18 MED ORDER — ACETAMINOPHEN 10 MG/ML IV SOLN
1000.0000 mg | Freq: Once | INTRAVENOUS | Status: AC
  Administered 2014-11-18: 1000 mg via INTRAVENOUS
  Filled 2014-11-18: qty 100

## 2014-11-18 MED ORDER — ONDANSETRON HCL 4 MG/2ML IJ SOLN
4.0000 mg | Freq: Four times a day (QID) | INTRAMUSCULAR | Status: DC | PRN
  Administered 2014-11-18: 4 mg via INTRAVENOUS
  Filled 2014-11-18: qty 2

## 2014-11-18 MED ORDER — SODIUM CHLORIDE 0.9 % IJ SOLN
INTRAMUSCULAR | Status: AC
  Filled 2014-11-18: qty 10

## 2014-11-18 MED ORDER — PANTOPRAZOLE SODIUM 40 MG PO TBEC
40.0000 mg | DELAYED_RELEASE_TABLET | Freq: Every day | ORAL | Status: DC
  Administered 2014-11-18 – 2014-11-19 (×2): 40 mg via ORAL
  Filled 2014-11-18 (×2): qty 1

## 2014-11-18 MED ORDER — NEOSTIGMINE METHYLSULFATE 10 MG/10ML IV SOLN
INTRAVENOUS | Status: DC | PRN
Start: 2014-11-18 — End: 2014-11-18
  Administered 2014-11-18: 5 mg via INTRAVENOUS

## 2014-11-18 MED ORDER — LACTATED RINGERS IV SOLN
INTRAVENOUS | Status: DC | PRN
  Administered 2014-11-18 (×2): via INTRAVENOUS

## 2014-11-18 MED ORDER — MIDAZOLAM HCL 2 MG/2ML IJ SOLN
INTRAMUSCULAR | Status: AC
  Filled 2014-11-18: qty 2

## 2014-11-18 MED ORDER — METOPROLOL TARTRATE 25 MG PO TABS
25.0000 mg | ORAL_TABLET | Freq: Every morning | ORAL | Status: DC
  Filled 2014-11-18 (×2): qty 1

## 2014-11-18 MED ORDER — EPHEDRINE SULFATE 50 MG/ML IJ SOLN
INTRAMUSCULAR | Status: AC
  Filled 2014-11-18: qty 1

## 2014-11-18 MED ORDER — GLYCOPYRROLATE 0.2 MG/ML IJ SOLN
INTRAMUSCULAR | Status: DC | PRN
  Administered 2014-11-18: .8 mg via INTRAVENOUS

## 2014-11-18 MED ORDER — ATROPINE SULFATE 0.4 MG/ML IJ SOLN
INTRAMUSCULAR | Status: AC
  Filled 2014-11-18: qty 2

## 2014-11-18 MED ORDER — FENTANYL CITRATE 0.05 MG/ML IJ SOLN
25.0000 ug | INTRAMUSCULAR | Status: DC | PRN
  Administered 2014-11-18 (×4): 50 ug via INTRAVENOUS

## 2014-11-18 MED ORDER — HYDROMORPHONE HCL 1 MG/ML IJ SOLN
1.0000 mg | INTRAMUSCULAR | Status: DC | PRN
  Administered 2014-11-18 – 2014-11-19 (×4): 1 mg via INTRAVENOUS
  Filled 2014-11-18 (×5): qty 1

## 2014-11-18 MED ORDER — ONDANSETRON HCL 4 MG/2ML IJ SOLN
INTRAMUSCULAR | Status: AC
  Filled 2014-11-18: qty 2

## 2014-11-18 MED ORDER — MIDAZOLAM HCL 5 MG/5ML IJ SOLN
INTRAMUSCULAR | Status: DC | PRN
  Administered 2014-11-18: 2 mg via INTRAVENOUS

## 2014-11-18 MED ORDER — FENTANYL CITRATE 0.05 MG/ML IJ SOLN
INTRAMUSCULAR | Status: AC
  Filled 2014-11-18: qty 2

## 2014-11-18 MED ORDER — HYDROCODONE-ACETAMINOPHEN 5-325 MG PO TABS
1.0000 | ORAL_TABLET | ORAL | Status: DC | PRN
  Administered 2014-11-19: 1 via ORAL
  Filled 2014-11-18: qty 1

## 2014-11-18 MED ORDER — GLYCOPYRROLATE 0.2 MG/ML IJ SOLN
INTRAMUSCULAR | Status: AC
  Filled 2014-11-18: qty 4

## 2014-11-18 MED ORDER — CALCIUM CARBONATE 1250 (500 CA) MG PO TABS
1.0000 | ORAL_TABLET | Freq: Three times a day (TID) | ORAL | Status: DC
  Administered 2014-11-18 – 2014-11-19 (×3): 500 mg via ORAL
  Filled 2014-11-18 (×7): qty 1

## 2014-11-18 MED ORDER — SUCCINYLCHOLINE CHLORIDE 20 MG/ML IJ SOLN
INTRAMUSCULAR | Status: DC | PRN
  Administered 2014-11-18: 140 mg via INTRAVENOUS

## 2014-11-18 MED ORDER — PROMETHAZINE HCL 25 MG/ML IJ SOLN: 6.2500 mg | INTRAMUSCULAR | Status: DC | PRN

## 2014-11-18 MED ORDER — DEXAMETHASONE SODIUM PHOSPHATE 10 MG/ML IJ SOLN
INTRAMUSCULAR | Status: DC | PRN
  Administered 2014-11-18: 10 mg via INTRAVENOUS

## 2014-11-18 MED ORDER — LIDOCAINE HCL (CARDIAC) 20 MG/ML IV SOLN
INTRAVENOUS | Status: DC | PRN
  Administered 2014-11-18: 100 mg via INTRAVENOUS

## 2014-11-18 MED ORDER — LIDOCAINE HCL (CARDIAC) 20 MG/ML IV SOLN
INTRAVENOUS | Status: AC
  Filled 2014-11-18: qty 5

## 2014-11-18 MED ORDER — ROCURONIUM BROMIDE 100 MG/10ML IV SOLN
INTRAVENOUS | Status: DC | PRN
  Administered 2014-11-18: 10 mg via INTRAVENOUS
  Administered 2014-11-18: 30 mg via INTRAVENOUS

## 2014-11-18 MED ORDER — ROCURONIUM BROMIDE 100 MG/10ML IV SOLN
INTRAVENOUS | Status: AC
  Filled 2014-11-18: qty 1

## 2014-11-18 MED ORDER — ONDANSETRON HCL 4 MG PO TABS: 4.0000 mg | ORAL_TABLET | Freq: Four times a day (QID) | ORAL | Status: DC | PRN

## 2014-11-18 MED ORDER — KCL IN DEXTROSE-NACL 20-5-0.45 MEQ/L-%-% IV SOLN
INTRAVENOUS | Status: DC
  Administered 2014-11-18: 14:00:00 via INTRAVENOUS
  Filled 2014-11-18 (×2): qty 1000

## 2014-11-18 MED ORDER — CALCIUM CARBONATE 1250 (500 CA) MG PO TABS
2.0000 | ORAL_TABLET | Freq: Three times a day (TID) | ORAL | Status: DC
  Filled 2014-11-18 (×3): qty 2

## 2014-11-18 MED ORDER — PROPOFOL 10 MG/ML IV BOLUS
INTRAVENOUS | Status: DC | PRN
  Administered 2014-11-18: 20 mg via INTRAVENOUS
  Administered 2014-11-18: 200 mg via INTRAVENOUS

## 2014-11-18 MED ORDER — FENTANYL CITRATE 0.05 MG/ML IJ SOLN
INTRAMUSCULAR | Status: DC | PRN
  Administered 2014-11-18 (×5): 50 ug via INTRAVENOUS

## 2014-11-18 MED ORDER — ONDANSETRON HCL 4 MG/2ML IJ SOLN
INTRAMUSCULAR | Status: DC | PRN
  Administered 2014-11-18: 4 mg via INTRAVENOUS

## 2014-11-18 MED ORDER — 0.9 % SODIUM CHLORIDE (POUR BTL) OPTIME
TOPICAL | Status: DC | PRN
  Administered 2014-11-18: 1000 mL

## 2014-11-18 MED ORDER — DEXAMETHASONE SODIUM PHOSPHATE 10 MG/ML IJ SOLN
INTRAMUSCULAR | Status: AC
  Filled 2014-11-18: qty 1

## 2014-11-18 MED ORDER — NEOSTIGMINE METHYLSULFATE 10 MG/10ML IV SOLN
INTRAVENOUS | Status: AC
  Filled 2014-11-18: qty 1

## 2014-11-18 MED ORDER — ACETAMINOPHEN 325 MG PO TABS
650.0000 mg | ORAL_TABLET | ORAL | Status: DC | PRN
  Administered 2014-11-18: 650 mg via ORAL
  Filled 2014-11-18 (×2): qty 2

## 2014-11-18 MED ORDER — PROPOFOL 10 MG/ML IV BOLUS
INTRAVENOUS | Status: AC
  Filled 2014-11-18: qty 20

## 2014-11-18 MED ORDER — FENTANYL CITRATE 0.05 MG/ML IJ SOLN
INTRAMUSCULAR | Status: AC
Start: 2014-11-18 — End: 2014-11-18
  Filled 2014-11-18: qty 2

## 2014-11-18 MED ORDER — FENTANYL CITRATE 0.05 MG/ML IJ SOLN
INTRAMUSCULAR | Status: AC
  Filled 2014-11-18: qty 5

## 2014-11-18 MED ORDER — EPHEDRINE SULFATE 50 MG/ML IJ SOLN
INTRAMUSCULAR | Status: DC | PRN
  Administered 2014-11-18 (×3): 5 mg via INTRAVENOUS

## 2014-11-18 SURGICAL SUPPLY — 38 items
APL SKNCLS STERI-STRIP NONHPOA (GAUZE/BANDAGES/DRESSINGS) ×1
ATTRACTOMAT 16X20 MAGNETIC DRP (DRAPES) ×3 IMPLANT
BENZOIN TINCTURE PRP APPL 2/3 (GAUZE/BANDAGES/DRESSINGS) ×3 IMPLANT
BLADE HEX COATED 2.75 (ELECTRODE) ×3 IMPLANT
BLADE SURG 15 STRL LF DISP TIS (BLADE) ×1 IMPLANT
BLADE SURG 15 STRL SS (BLADE) ×3
CHLORAPREP W/TINT 26ML (MISCELLANEOUS) ×3 IMPLANT
CLIP TI MEDIUM 6 (CLIP) ×12 IMPLANT
CLIP TI WIDE RED SMALL 6 (CLIP) ×10 IMPLANT
CLOSURE WOUND 1/2 X4 (GAUZE/BANDAGES/DRESSINGS) ×1
DISSECTOR ROUND CHERRY 3/8 STR (MISCELLANEOUS) IMPLANT
DRAPE LAPAROTOMY T 98X78 PEDS (DRAPES) ×3 IMPLANT
DRESSING SURGICEL FIBRLLR 1X2 (HEMOSTASIS) ×1 IMPLANT
DRSG SURGICEL FIBRILLAR 1X2 (HEMOSTASIS) ×3
ELECT REM PT RETURN 9FT ADLT (ELECTROSURGICAL) ×3
ELECTRODE REM PT RTRN 9FT ADLT (ELECTROSURGICAL) ×1 IMPLANT
GAUZE SPONGE 4X4 12PLY STRL (GAUZE/BANDAGES/DRESSINGS) ×2 IMPLANT
GAUZE SPONGE 4X4 16PLY XRAY LF (GAUZE/BANDAGES/DRESSINGS) ×3 IMPLANT
GLOVE SURG ORTHO 8.0 STRL STRW (GLOVE) ×3 IMPLANT
GOWN STRL REUS W/TWL XL LVL3 (GOWN DISPOSABLE) ×6 IMPLANT
KIT BASIN OR (CUSTOM PROCEDURE TRAY) ×3 IMPLANT
NS IRRIG 1000ML POUR BTL (IV SOLUTION) ×3 IMPLANT
PACK BASIC VI WITH GOWN DISP (CUSTOM PROCEDURE TRAY) ×3 IMPLANT
PENCIL BUTTON HOLSTER BLD 10FT (ELECTRODE) ×3 IMPLANT
SHEARS HARMONIC 9CM CVD (BLADE) ×3 IMPLANT
STAPLER VISISTAT 35W (STAPLE) IMPLANT
STRIP CLOSURE SKIN 1/2X4 (GAUZE/BANDAGES/DRESSINGS) ×2 IMPLANT
SUT MNCRL AB 4-0 PS2 18 (SUTURE) ×3 IMPLANT
SUT SILK 2 0 (SUTURE)
SUT SILK 2-0 18XBRD TIE 12 (SUTURE) IMPLANT
SUT SILK 3 0 (SUTURE)
SUT SILK 3-0 18XBRD TIE 12 (SUTURE) IMPLANT
SUT VIC AB 3-0 SH 18 (SUTURE) ×6 IMPLANT
SYR BULB IRRIGATION 50ML (SYRINGE) ×3 IMPLANT
TAPE CLOTH SURG 4X10 WHT LF (GAUZE/BANDAGES/DRESSINGS) ×2 IMPLANT
TOWEL OR 17X26 10 PK STRL BLUE (TOWEL DISPOSABLE) ×3 IMPLANT
TOWEL OR NON WOVEN STRL DISP B (DISPOSABLE) ×3 IMPLANT
YANKAUER SUCT BULB TIP 10FT TU (MISCELLANEOUS) ×3 IMPLANT

## 2014-11-18 NOTE — Brief Op Note (Signed)
11/18/2014  9:30 AM  PATIENT:  Shannon [name]  64 y.o. female  PRE-OPERATIVE DIAGNOSIS:  THYROID GOITER  POST-OPERATIVE DIAGNOSIS:  THYROID GOITER   PROCEDURE:  Procedure(s): TOTAL THYROIDECTOMY (N/A)  SURGEON:  Surgeon(s) and Role:    * Darnell Levelodd Erron Wengert, MD - Primary    * Harriette Bouillonhomas Cornett, MD - Assisting  ANESTHESIA:   general  EBL:  Total I/O In: 1000 [I.V.:1000] Out: -   BLOOD ADMINISTERED:none  DRAINS: none   LOCAL MEDICATIONS USED:  NONE  SPECIMEN:  Excision  DISPOSITION OF SPECIMEN:  PATHOLOGY  COUNTS:  YES  TOURNIQUET:  * No tourniquets in log *  DICTATION: .Other Dictation: Dictation Number [id number]  PLAN OF CARE: Admit for overnight observation  PATIENT DISPOSITION:  PACU - hemodynamically stable.   Delay start of Pharmacological VTE agent (>24hrs) due to surgical blood loss or risk of bleeding: yes  [name] M. Jai Bear, MD, FACS General & Endocrine Surgery [facility] Office: [phone]

## 2014-11-18 NOTE — Progress Notes (Signed)
Interrpeter  Dineen KidMonica Price at bedside.

## 2014-11-18 NOTE — Anesthesia Procedure Notes (Signed)
Procedure Name: Intubation Date/Time: 11/18/2014 7:31 AM Performed by: Jarvis NewcomerARMISTEAD, Dionna Wiedemann A Pre-anesthesia Checklist: Patient identified, Timeout performed, Emergency Drugs available, Suction available and Patient being monitored Patient Re-evaluated:Patient Re-evaluated prior to inductionOxygen Delivery Method: Circle system utilized Preoxygenation: Pre-oxygenation with 100% oxygen Intubation Type: IV induction Ventilation: Mask ventilation without difficulty Laryngoscope Size: Mac and 4 Grade View: Grade I Tube type: Oral Tube size: 7.0 mm Number of attempts: 1 Airway Equipment and Method: Stylet Secured at: 22 cm Tube secured with: Tape Dental Injury: Teeth and Oropharynx as per pre-operative assessment

## 2014-11-18 NOTE — Op Note (Signed)
[name], [name]      ACCOUNT NO.:  [id number]  MEDICAL RECORD NO.:  [id number]  LOCATION:  [facility]                         FACILITY:  [facility]  PHYSICIAN:  [name], MD      DATE OF BIRTH:  [birth year]  DATE OF PROCEDURE:  11/18/2014                              OPERATIVE REPORT   PREOPERATIVE DIAGNOSIS:  Thyroid goiter.  POSTOPERATIVE DIAGNOSIS:  Thyroid goiter.  PROCEDURE:  Total thyroidectomy.  SURGEON:  [name], MD, FACS  ASSISTANT:  Clovis Pu. Cornett, MD, FACS  ANESTHESIA:  General per Dr. Berneice Heinrich.  ESTIMATED BLOOD LOSS:  Minimal.  PREPARATION:  ChloraPrep.  COMPLICATIONS:  None.  INDICATIONS:  The patient is a 64 year old female referred by Dr. Carlus Pavlov from Endocrinology with thyroid goiter.  The patient had developed a large goiter with tracheal narrowing.  CT scan in 2013 showed a large thyroid goiter with significant tracheal narrowing.  This was confirmed on ultrasound exam in August 2015.  The patient had developed dyspnea.  Pulmonary function test showed a restrictive component.  The patient now comes to Surgery for thyroidectomy for relief of dyspnea and tracheal narrowing.  BODY OF REPORT:  Procedure was done in OR #1 at the [facility].  The patient was brought to the operating room and placed in supine position on the operating room table.  Following administration of general anesthesia, the patient was positioned and then prepped and draped in the usual aseptic fashion.  After ascertaining that an adequate level of anesthesia had been achieved, a Kocher incision was made with a #15 blade.  Dissection was carried through the subcutaneous tissues and platysma.  Hemostasis was achieved with the electrocautery.  Skin flaps were elevated cephalad and caudad from the thyroid notch to the sternal notch.  A Mahorner self-retaining retractor was placed for exposure.  Strap muscles were incised in the midline and  dissection was begun on the left side.  Strap muscles were reflected laterally exposing an enlarged multinodular left thyroid lobe. Thyroid parenchyma was quite soft.  It was gently mobilized.  There were some very large venous tributaries, which were dissected out and divided between Ligaclips with the Harmonic scalpel.  Superior pole was dissected out and superior pole vessel was divided individually between small and medium Ligaclips with the Harmonic scalpel.  Care was taken to preserve the parathyroid gland.  Inferior pole was gently mobilized with blunt dissection and venous tributaries divided between Ligaclips with the Harmonic scalpel.  Gland was rolled anteriorly.  Branches of the inferior thyroid artery were divided between small and medium Ligaclips. Gland was mobilized further anteriorly and the ligament of Allyson Sabal was released with the electrocautery.  There was a moderate-sized pyramidal lobe, which was dissected off of the anterior thyroid cartilage using the electrocautery for hemostasis.  Isthmus was then mobilized across the midline.  Dry pack was placed in the left neck.  Next, we turned our attention to the right thyroid lobe.  Right thyroid lobe was somewhat larger in size.  It was also multinodular.  It was quite soft.  With gentle dissection, middle thyroid vein was divided between Ligaclips with the Harmonic scalpel.  Superior pole was dissected out and superior pole vessel was  divided individually between small and medium Ligaclips with the Harmonic scalpel.  Inferior pole was delivered out of the chest.  Venous tributaries were divided between Ligaclips.  Gland was rolled anteriorly and the branches of the inferior thyroid artery were divided between small and medium Ligaclips with the Harmonic scalpel.  Recurrent laryngeal nerve was identified and preserved.  Ligament of Allyson SabalBerry was released with the electrocautery and the gland was mobilized onto the anterior  trachea, from which, it was completely resected.  Suture was used to mark the right superior pole of the thyroid gland.  The entire gland was submitted to Pathology for review.  The neck was irrigated with warm saline, which was evacuated.  Good hemostasis was achieved.  Fibrillar was placed throughout the operative field.  Strap muscles were reapproximated in the midline with interrupted 3-0 Vicryl sutures.  Platysma was closed with interrupted 3- 0 Vicryl sutures.  Skin was closed with a running 4-0 Monocryl subcuticular suture.  Wound was washed and dried, and benzoin and Steri- Strips were applied.  Sterile dressings were applied.  The patient was awakened from anesthesia and brought to the recovery room.  The patient tolerated the procedure well.   [name] M. Yedidya Duddy, MD, [facility] Office: [phone]    TMG/MEDQ  D:  11/18/2014  T:  11/18/2014  Job:  [id number]  cc:   Carlus Pavlovristina Gherghe, M.D. Fax: [phone]

## 2014-11-18 NOTE — Transfer of Care (Signed)
Immediate Anesthesia Transfer of Care Note  Patient: Shannon [name]  Procedure(s) Performed: Procedure(s): TOTAL THYROIDECTOMY (N/A)  Patient Location: PACU  Anesthesia Type:General  Level of Consciousness: awake, alert , oriented and patient cooperative  Airway & Oxygen Therapy: Patient Spontanous Breathing and Patient connected to face mask oxygen  Post-op Assessment: Report given to PACU RN, Post -op Vital signs reviewed and stable and Patient moving all extremities  Post vital signs: Reviewed and stable  Complications: No apparent anesthesia complications

## 2014-11-18 NOTE — H&P (Signed)
General Surgery [facility]  Shannon [name] DOB: 04/21/1951 Single / Language: Undefined / Race: Undefined Female  History of Present Illness  The patient is a 64 year old female who presents with a complaint of enlarged thyroid. Patient is referred by Dr. Carlus Pavlovristina Gherghe for evaluation of large thyroid goiter with tracheal narrowing.  Patient was diagnosed on CT scan in 2013 with a moderate to large thyroid goiter as part of an evaluation for dyspnea. Patient underwent thyroid ultrasound on June 02, 2014. This shows an enlarged thyroid gland with the right lobe measuring 7.6 cm and left lobe measuring 10.5 cm. There was significant tracheal narrowing. Patient had pulmonary function test performed which did show a restrictive component. She is now referred for consideration for thyroidectomy to prevent additional tracheal narrowing.  Thyroid ultrasound demonstrated a dominant thyroid nodule in the left lobe superiorly measuring 1.8 cm with central calcification. Biopsy has not been performed.  Thyroid function tests show a normal TSH level of 0.401.  Patient has not been on thyroid hormone. She has had no prior head or neck surgery. There is no family history of thyroid disease and no family history of endocrine neoplasm.    Other Problems  Gastroesophageal Reflux Disease  Past Surgical History Hip Surgery Bilateral.  Diagnostic Studies History  Colonoscopy 1-5 years ago Mammogram 1-3 years ago  Allergies  No Known Drug Allergies10/30/2015  Medication History  Lopressor (50MG  Tablet, Oral) Active. PriLOSEC (20MG  Capsule DR, Oral) Active.  Social History  Caffeine use Coffee. No alcohol use No drug use Tobacco use Never smoker.  Family History  Family history unknown First Degree Relatives  Pregnancy / Birth History  Age of menopause 1>60 Gravida 4610 Maternal age 64-20 Para 10  Review of Systems General Not Present-  Appetite Loss, Chills, Fatigue, Fever, Night Sweats, Weight Gain and Weight Loss. Skin Not Present- Change in Wart/Mole, Dryness, Hives, Jaundice, New Lesions, Non-Healing Wounds, Rash and Ulcer. Respiratory Present- Difficulty Breathing. Not Present- Bloody sputum, Chronic Cough, Snoring and Wheezing. Breast Not Present- Breast Mass, Breast Pain, Nipple Discharge and Skin Changes. Cardiovascular Present- Difficulty Breathing Lying Down. Not Present- Chest Pain, Leg Cramps, Palpitations, Rapid Heart Rate, Shortness of Breath and Swelling of Extremities. Female Genitourinary Not Present- Frequency, Nocturia, Painful Urination, Pelvic Pain and Urgency. Neurological Not Present- Decreased Memory, Fainting, Headaches, Numbness, Seizures, Tingling, Tremor, Trouble walking and Weakness. Psychiatric Not Present- Anxiety, Bipolar, Change in Sleep Pattern, Depression, Fearful and Frequent crying. Endocrine Not Present- Cold Intolerance, Excessive Hunger, Hair Changes, Heat Intolerance, Hot flashes and New Diabetes. Hematology Not Present- Easy Bruising, Excessive bleeding, Gland problems, HIV and Persistent Infections.   Vitals 08/27/2014 2:04 PM Weight: 265.38 lb Height: 63in Body Surface Area: 2.31 m Body Mass Index: 47.01 kg/m Temp.: 72F  Pulse: 80 (Regular)  BP: 150/80 (Sitting, Left Arm, Standard)    Physical Exam  General - appears comfortable, no distress; not diaphorectic  HEENT - normocephalic; sclerae clear, gaze conjugate; mucous membranes moist, dentition good; voice normal  Neck - asymmetric on extension; no palpable anterior or posterior cervical adenopathy; enlarged, soft thyroid gland extending below the clavicles bilaterally; no tenderness  Chest - clear bilaterally with rhonchi, rales, or wheeze  Cor - regular rhythm with normal rate; no significant murmur  Ext - non-tender without significant edema or lymphedema  Neuro - grossly intact; no  tremor    Assessment & Plan  GOITER DIFFUSE, 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 Her family member acted as Nurse, learning disability. I provided her with written literature on thyroid disease in Spanish to review at home.  I have reviewed the records from her endocrinologist and her pulmonologist. I agree with their recommendation to proceed with thyroidectomy to avoid further tracheal narrowing and restrictive effects on her airway. We discussed total thyroidectomy at length. We discussed risk and benefits of the procedure including the risk of recurrent laryngeal nerve injury and injury to parathyroid glands. We discussed the hospital stay to be anticipated and the postoperative recovery. We discussed the need for lifelong thyroid hormone replacement. They understand and wish to proceed.  The risks and benefits of the procedure have been discussed at length with the patient. The patient understands the proposed procedure, potential alternative treatments, and the course of recovery to be expected. All of the patient's questions have been answered at this time. The patient wishes to proceed with surgery.  [name], MD, FACS General & Endocrine Surgery [facility] Office: [phone]

## 2014-11-18 NOTE — Anesthesia Postprocedure Evaluation (Signed)
  Anesthesia Post-op Note  Patient: Shannon [name]  Procedure(s) Performed: Procedure(s): TOTAL THYROIDECTOMY (N/A)  Patient Location: PACU  Anesthesia Type:General  Level of Consciousness: awake and alert   Airway and Oxygen Therapy: Patient Spontanous Breathing and Patient connected to nasal cannula oxygen  Post-op Pain: none  Post-op Assessment: Post-op Vital signs reviewed and Patient's Cardiovascular Status Stable  Post-op Vital Signs: Reviewed and stable  Last Vitals:  Filed Vitals:   11/18/14 0945  BP: 138/77  Pulse: 73  Temp: 36.8 C  Resp: 12    Complications: No apparent anesthesia complications

## 2014-11-19 LAB — BASIC METABOLIC PANEL
ANION GAP: 11 (ref 5–15)
BUN: 17 mg/dL (ref 6–23)
CO2: 30 mmol/L (ref 19–32)
Calcium: 8.1 mg/dL — ABNORMAL LOW (ref 8.4–10.5)
Chloride: 94 mEq/L — ABNORMAL LOW (ref 96–112)
Creatinine, Ser: 0.61 mg/dL (ref 0.50–1.10)
GFR calc Af Amer: >90 mL/min (ref >90)
GFR calc non Af Amer: >90 mL/min (ref >90)
GLUCOSE: 132 mg/dL — AB (ref 70–99)
Potassium: 3.8 mmol/L (ref 3.5–5.1)
Sodium: 135 mmol/L (ref 135–145)

## 2014-11-19 MED ORDER — SYNTHROID 88 MCG PO TABS: 88.0000 ug | ORAL_TABLET | Freq: Every day | ORAL | Status: DC

## 2014-11-19 MED ORDER — CALCIUM CARBONATE-VITAMIN D 500-200 MG-UNIT PO TABS: 2.0000 | ORAL_TABLET | Freq: Three times a day (TID) | ORAL | Status: DC

## 2014-11-19 MED ORDER — SODIUM CHLORIDE 0.9 % IV SOLN
2.0000 g | INTRAVENOUS | Status: AC
  Administered 2014-11-19: 2 g via INTRAVENOUS
  Filled 2014-11-19: qty 20

## 2014-11-19 MED ORDER — OXYCODONE HCL 5 MG PO TABS: 5.0000 mg | ORAL_TABLET | Freq: Four times a day (QID) | ORAL | Status: DC | PRN

## 2014-11-19 NOTE — Discharge Instructions (Signed)

## 2014-11-19 NOTE — Discharge Summary (Signed)
  Physician Discharge Summary [facility]  Patient ID: Shannon [name] MRN: [id number] DOB/AGE: 1951-04-11 64 y.o.  Admit date: 11/18/2014 Discharge date: 11/19/2014  Admission Diagnoses:  Thyroid goiter   Discharge Diagnoses:  Principal Problem:   Thyromegaly Active Problems:   Thyroid goiter   Discharged Condition: good  Hospital Course: Patient was admitted for observation following thyroid surgery.  Post op course was uncomplicated.  Pain was well controlled.  Tolerated diet.  Post op calcium level on morning following surgery was 8.1 mg/dl.  Patient given calcium gluconate infusion 2 gm prior to discharge.  Patient was prepared for discharge home on POD#1.  Consults: None  Treatments: surgery: total thyroidectomy  Discharge Exam: Blood pressure 115/56, pulse 81, temperature 99 F (37.2 C), temperature source Oral, resp. rate 18, height 5\' 2"  (1.575 m), weight 270 lb (122.471 kg), SpO2 94 %. HEENT - clear Neck - wound dry and intact; mild STS; voice normal Chest - clear bilaterally Cor - RRR  Disposition: Home  Discharge Instructions    Apply dressing    Complete by:  As directed   Apply light gauze dressing to wound before discharge home today.     Diet - low sodium heart healthy    Complete by:  As directed      Increase activity slowly    Complete by:  As directed      Remove dressing in 24 hours    Complete by:  As directed             Medication List    TAKE these medications        calcium-vitamin D 500-200 MG-UNIT per tablet  Commonly known as:  OSCAL WITH D  Take 2 tablets by mouth 3 (three) times daily.     ciprofloxacin 250 MG tablet  Commonly known as:  CIPRO  Take 1 tablet (250 mg total) by mouth 2 (two) times daily.     metoprolol 50 MG tablet  Commonly known as:  LOPRESSOR  Take 25 mg by mouth every morning.     metoprolol 50 MG tablet  Commonly known as:  LOPRESSOR  Take 1/4 tablet by mouth once daily.     omeprazole 40 MG capsule  Commonly known as:  PRILOSEC  Take 40 mg by mouth daily.     SYNTHROID 88 MCG tablet  Generic drug:  levothyroxine  Take 1 tablet (88 mcg total) by mouth daily before breakfast.           Follow-up Information    Follow up with Velora HecklerGERKIN,Claudine Stallings M, MD. Schedule an appointment as soon as possible for a visit in 3 weeks.   Specialty:  General Surgery   Why:  For wound re-check   Contact information:   [address] [place] [address] [phone]       [name] M. Huntington Leverich, MD, [facility] Office: [phone]   Signed: Velora HecklerGERKIN,Kaisyn Millea M 11/19/2014, 7:53 AM

## 2014-11-19 NOTE — Progress Notes (Signed)
UR completed 

## 2014-11-22 ENCOUNTER — Encounter (HOSPITAL_COMMUNITY): Payer: Self-pay | Admitting: Surgery

## 2014-11-23 ENCOUNTER — Emergency Department (HOSPITAL_COMMUNITY)
Admission: EM | Admit: 2014-11-23 | Discharge: 2014-11-23 | Disposition: A | Discharge status: Home / Self Care | Payer: Self-pay | Attending: Emergency Medicine | Admitting: Emergency Medicine

## 2014-11-23 ENCOUNTER — Other Ambulatory Visit (INDEPENDENT_AMBULATORY_CARE_PROVIDER_SITE_OTHER): Payer: Self-pay | Admitting: Surgery

## 2014-11-23 ENCOUNTER — Other Ambulatory Visit: Payer: Self-pay | Admitting: Surgery

## 2014-11-23 ENCOUNTER — Encounter (HOSPITAL_COMMUNITY): Payer: Self-pay | Admitting: Emergency Medicine

## 2014-11-23 DIAGNOSIS — K219 Gastro-esophageal reflux disease without esophagitis: Secondary | ICD-10-CM | POA: Insufficient documentation

## 2014-11-23 DIAGNOSIS — Z8739 Personal history of other diseases of the musculoskeletal system and connective tissue: Secondary | ICD-10-CM | POA: Insufficient documentation

## 2014-11-23 DIAGNOSIS — Z792 Long term (current) use of antibiotics: Secondary | ICD-10-CM | POA: Insufficient documentation

## 2014-11-23 DIAGNOSIS — I10 Essential (primary) hypertension: Secondary | ICD-10-CM | POA: Insufficient documentation

## 2014-11-23 DIAGNOSIS — Z79899 Other long term (current) drug therapy: Secondary | ICD-10-CM | POA: Insufficient documentation

## 2014-11-23 LAB — CBC WITH DIFFERENTIAL/PLATELET
BASOS ABS: 0 10*3/uL (ref 0.0–0.1)
Basophils Relative: 0 % (ref 0–1)
EOS PCT: 5 % (ref 0–5)
Eosinophils Absolute: 0.3 10*3/uL (ref 0.0–0.7)
HCT: 42.1 % (ref 36.0–46.0)
HEMOGLOBIN: 13.4 g/dL (ref 12.0–15.0)
LYMPHS ABS: 1.7 10*3/uL (ref 0.7–4.0)
Lymphocytes Relative: 29 % (ref 12–46)
MCH: 27.9 pg (ref 26.0–34.0)
MCHC: 31.8 g/dL (ref 30.0–36.0)
MCV: 87.7 fL (ref 78.0–100.0)
MONOS PCT: 8 % (ref 3–12)
Monocytes Absolute: 0.5 10*3/uL (ref 0.1–1.0)
Neutro Abs: 3.4 10*3/uL (ref 1.7–7.7)
Neutrophils Relative %: 58 % (ref 43–77)
Platelets: 308 10*3/uL (ref 150–400)
RBC: 4.8 MIL/uL (ref 3.87–5.11)
RDW: 14.9 % (ref 11.5–15.5)
WBC: 5.9 10*3/uL (ref 4.0–10.5)

## 2014-11-23 LAB — BASIC METABOLIC PANEL
Anion gap: 11 (ref 5–15)
CHLORIDE: 96 mmol/L (ref 96–112)
CO2: 30 mmol/L (ref 19–32)
Calcium: 6.3 mg/dL — CL (ref 8.4–10.5)
Creatinine, Ser: 0.63 mg/dL (ref 0.50–1.10)
GFR calc Af Amer: >90 mL/min (ref >90)
GLUCOSE: 99 mg/dL (ref 70–99)
Potassium: 3.6 mmol/L (ref 3.5–5.1)
Sodium: 137 mmol/L (ref 135–145)

## 2014-11-23 LAB — I-STAT CHEM 8, ED
BUN: <3 mg/dL — ABNORMAL LOW (ref 6–23)
CREATININE: 0.6 mg/dL (ref 0.50–1.10)
Calcium, Ion: 0.74 mmol/L — ABNORMAL LOW (ref 1.13–1.30)
Chloride: 94 mmol/L — ABNORMAL LOW (ref 96–112)
Glucose, Bld: 96 mg/dL (ref 70–99)
HEMATOCRIT: 46 % (ref 36.0–46.0)
HEMOGLOBIN: 15.6 g/dL — AB (ref 12.0–15.0)
POTASSIUM: 4.6 mmol/L (ref 3.5–5.1)
SODIUM: 136 mmol/L (ref 135–145)
TCO2: 29 mmol/L (ref 0–100)

## 2014-11-23 LAB — HEPATIC FUNCTION PANEL
ALT: 29 U/L (ref 0–35)
AST: 39 U/L — AB (ref 0–37)
Albumin: 3.4 g/dL — ABNORMAL LOW (ref 3.5–5.2)
Alkaline Phosphatase: 57 U/L (ref 39–117)
BILIRUBIN TOTAL: 0.4 mg/dL (ref 0.3–1.2)
Bilirubin, Direct: <0.1 mg/dL (ref 0.0–0.5)
Total Protein: 6.5 g/dL (ref 6.0–8.3)

## 2014-11-23 LAB — CALCIUM: Calcium: 7 mg/dL — ABNORMAL LOW (ref 8.4–10.5)

## 2014-11-23 MED ORDER — SODIUM CHLORIDE 0.9 % IV SOLN
1.0000 g | Freq: Once | INTRAVENOUS | Status: AC
  Administered 2014-11-23: 1 g via INTRAVENOUS
  Filled 2014-11-23: qty 10

## 2014-11-23 NOTE — ED Provider Notes (Signed)
CSN: [id number]     Arrival date & time 11/23/14  1608 History   First MD Initiated Contact with Patient 11/23/14 1740     Chief Complaint  Patient presents with  . Numbness  . hypocalcemia    HPI Pt presents to the ED with low calcium.  Pt had a thyroidectomy on the 21st.  Starting yesterday she began feeling lightheaded and weak.    She has also noticed that her lips are twitching and her muscles are spasming.  She has had some cough but no fever.  No shortness of breath.  She called her doctor and had lab tests performed.  She was called and told that her calcium level was low and she should come to the ED.  Past Medical History  Diagnosis Date  . Hypothyroidism   . Hypertension   . Varicose vein of leg   . Osteoarthritis   . GERD (gastroesophageal reflux disease)     occasional prilosec   Past Surgical History  Procedure Laterality Date  . Hip surgery  2008-9    right  . Hip surgery  2014    left  . Joint replacement      bil total hips  . Thyroidectomy N/A 11/18/2014    Procedure: TOTAL THYROIDECTOMY;  Surgeon: Darnell Levelodd Gerkin, MD;  Location: [facility];  Service: General;  Laterality: N/A;   Family History  Problem Relation Age of Onset  . High blood pressure     History  Substance Use Topics  . Smoking status: Never Smoker   . Smokeless tobacco: Never Used  . Alcohol Use: Yes   OB History    No data available     Review of Systems  All other systems reviewed and are negative.     Allergies  Review of patient's allergies indicates no known allergies.  Home Medications   Prior to Admission medications   Medication Sig Start Date End Date Taking? Authorizing Provider  calcium-vitamin D (OSCAL WITH D) 500-200 MG-UNIT per tablet Take 2 tablets by mouth 3 (three) times daily. 11/19/14  Yes Darnell Levelodd Gerkin, MD  metoprolol (LOPRESSOR) 50 MG tablet Take 25 mg by mouth every morning.   Yes Historical Provider, MD  omeprazole (PRILOSEC) 40 MG capsule Take 40 mg by mouth daily.    Yes Historical Provider, MD  oxyCODONE (OXY IR/ROXICODONE) 5 MG immediate release tablet Take 1-2 tablets (5-10 mg total) by mouth every 6 (six) hours as needed for moderate pain. 11/19/14  Yes Darnell Levelodd Gerkin, MD  SYNTHROID 88 MCG tablet Take 1 tablet (88 mcg total) by mouth daily before breakfast. 11/19/14  Yes Darnell Levelodd Gerkin, MD  ciprofloxacin (CIPRO) 250 MG tablet Take 1 tablet (250 mg total) by mouth 2 (two) times daily. Patient not taking: Reported on 11/09/2014 06/10/14   Sandford CrazeMelissa O'Sullivan, NP  metoprolol (LOPRESSOR) 50 MG tablet Take 1/4 tablet by mouth once daily. Patient not taking: Reported on 11/09/2014 06/02/14   Sandford CrazeMelissa O'Sullivan, NP   BP 125/64 mmHg  Pulse 72  Temp(Src) 98.2 F (36.8 C) (Oral)  Resp 16  SpO2 96% Physical Exam  Constitutional: She appears well-developed and well-nourished. No distress.  HENT:  Head: Normocephalic and atraumatic.  Right Ear: External ear normal.  Left Ear: External ear normal.  Eyes: Conjunctivae are normal. Right eye exhibits no discharge. Left eye exhibits no discharge. No scleral icterus.  Neck: Neck supple. No tracheal deviation present.  Cardiovascular: Normal rate, regular rhythm and intact distal pulses.   Pulmonary/Chest: Effort normal and  breath sounds normal. No stridor. No respiratory distress. She has no wheezes. She has no rales.  Abdominal: Soft. Bowel sounds are normal. She exhibits no distension. There is no tenderness. There is no rebound and no guarding.  Musculoskeletal: She exhibits no edema or tenderness.  Neurological: She is alert. She has normal strength. No cranial nerve deficit (no facial droop, extraocular movements intact, no slurred speech) or sensory deficit. She exhibits normal muscle tone. She displays no seizure activity. Coordination normal.  Skin: Skin is warm and dry. No rash noted.  Psychiatric: She has a normal mood and affect.  Nursing note and vitals reviewed.   ED Course  Procedures (including critical care  time) Labs Review Labs Reviewed  BASIC METABOLIC PANEL - Abnormal; Notable for the following:    BUN <5 (*)    Calcium 6.3 (*)    All other components within normal limits  HEPATIC FUNCTION PANEL - Abnormal; Notable for the following:    Albumin 3.4 (*)    AST 39 (*)    All other components within normal limits  I-STAT CHEM 8, ED - Abnormal; Notable for the following:    Chloride 94 (*)    BUN <3 (*)    Calcium, Ion 0.74 (*)    Hemoglobin 15.6 (*)    All other components within normal limits  CBC WITH DIFFERENTIAL/PLATELET    Imaging Review No results found.   EKG Interpretation   Date/Time:  Tuesday November 23 2014 16:33:30 EST Ventricular Rate:  79 PR Interval:  174 QRS Duration: 87 QT Interval:  555 QTC Calculation: 636 R Axis:   -40 Text Interpretation:  Sinus rhythm Left atrial enlargement Left axis  deviation Probable anteroseptal infarct, old Borderline repolarization  abnormality Prolonged QT interval Baseline wander in lead(s) I II aVR aVF  V1 V2 V6 No significant change since last tracing Confirmed by Aries Townley   MD-J, Haisley Arens ([id number]) on 11/23/2014 4:44:46 PM     Medications  calcium gluconate 1 g in sodium chloride 0.9 % 100 mL IVPB (0 g Intravenous Stopped 11/23/14 2051)    MDM   Final diagnoses:  Hypocalcemia   Ionized calcium level is  0.74.  Related to her recent thyroid surgery.  Calcium was 6.3 in the ED.  She was given a 1 gram calcium gluconate in the ED.  Pt is feeling better.  Able to ambulate.  Would like to go home.  Discussed with Dr Daphine Deutscher, on call for Dr Gerrit Friends.  They will follow up patient up closely in the office tomorrow.    Linwood Dibbles, MD 11/23/14 2207

## 2014-11-23 NOTE — ED Notes (Signed)
Patient had thyroidectomy 11/18/14. Patient states she has had numbness and tingling of extremities since yesterday and has been getting progressively worse. Granddaughter interpreted for the patient.  Patient's granddaughter notified surgery yesterday of numbness and tingling. Surgery increased patient's po calcium. Today, patient had lab work done and was called by the surgeon stating that her calcium was very low and needed to come to the ED.

## 2014-11-23 NOTE — Discharge Instructions (Signed)
Hipocalcemia - Adultos  (Hypocalcemia, Adult) Se considera hipocalcemia cuando hay un bajo nivel de calcio en sangre. El calcio es importante para que funcionen las clulas del organismo. Un nivel bajo de calcio en la sangre bajo puede causar una variedad de sntomas y afecciones.  CAUSAS   Bajos niveles de una protena del organismo llamada albmina.  Problemas o la extirpacin Barbadosquirrgica de las glndulas paratiroides. Las glndulas paratiroides mantienen el nivel de calcio en el cuerpo.  Disminucin de la produccin o uso inadecuado de la hormona paratiroidea.  Falta (deficiencia) de vitamina D, de magnesio o ambos.  Problemas intestinales que interfieren con la absorcin de nutrientes.  Alcoholismo.  Problemas renales.  Inflamacin del pncreas (pancreatitis).  Ciertos medicamentos.  Infectiones graves (sepsis).  Enfermedades infiltrantes. En estas enfermedades las glndulas paratiroides estn llenas de clulas o sustancias que normalmente no estn presentes. Por ejemplo:  Sarcoidosis.  Hemocromatosis.  Descomposicin de grandes cantidades de fibra muscular.  Altos niveles de fosfatos en el cuerpo.  Cncer.  Transfusiones de sangre masivas que normalmente ocurren en casos de traumatismos severos. SNTOMAS   Entumecimiento y hormigueo de los dedos de los pies o alrededor de Government social research officerla boca.  Dolores o EchoStarcalambres musculares, especialmente en las piernas, los pies y la espalda.  Espasmos musculares.  Dificultad para respirar o sibilancias.  Dificultad para tragar.  Cambios en el sonido de la voz.  Debilidad general  Desmayos.  Latidos cardacos rpidos (palpitaciones).  Dolor en el pecho.  Irritabilidad.  Dificultad para pensar.  Problemas de memoria o confusin.  Fatiga intensa.  Cambios en la personalidad.  Depresin y ansiedad.  Sacudidas incontrolables (convulsiones).  Cabello y uas gruesos y Air traffic controllerquebradizos.  Piel seca o enfermedades de la piel  (psoriasis, eczema, o dermatitis) de larga duracin (crnicas).  Opacidad del cristalino (cataratas).  Clicos o dolor en el abdomen. DIAGNSTICO  La hipocalcemia se diagnostica mediante anlisis de Manokotaksangre, donde se revela un bajo nivel de calcio en la Golden Valleysangre. Tambin podrn indicarle otros estudios, como un registro de la actividad elctrica del corazn (electrocardiograma, ECG), con el fin de diagnosticar la causa subyacente del trastorno.  TRATAMIENTO  El tratamiento de la hipocalcemia incluye la administracin de suplementos de calcio. Se pueden administrar por va oral o intravenosa (IV), segn la gravedad de los sntomas y el dficit. Tambin se Teaching laboratory technicianpueden administrar otros minerales (electrolitos), Humana Inccomo el magnesio.  INSTRUCCIONES PARA EL CUIDADO EN EL HOGAR   Consulte a un nutricionista para asegurarse de que est consumiendo una dieta lo ms saludable posible, o siga las instrucciones de la dieta como le indique su mdico.  OceanographerConcurra a las visitas de control, segn las indicaciones. SOLICITE ATENCIN MDICA DE INMEDIATO SI:   Siente dolor en el pecho.  Siente latidos cardacos rpidos o irregulares de Genuine Partsmanera persistente.  Tiene dificultad para respirar.  Se desmaya.  Siente fatiga cada vez ms intensa.  Observa una nueva hinchazn en los pies, tobillos o piernas.  Siente cada vez ms contracciones musculares.  Comienza a tener convulsiones.  Se siente confundido.  Sufre cambios en el estado de nimo, en la memoria o en la personalidad. ASEGRESE DE QUE:   Comprende estas instrucciones.  Controlar su enfermedad.  Solicitar ayuda de inmediato si no mejora o si empeora. Document Released: 10/01/2012 [facility]Exit[facility] Patient Information 2015 [place]ExitCare, MarylandLLC. This information is not intended to replace advice given to you by your health care provider. Make sure you discuss any questions you have with your health care provider.

## 2014-11-23 NOTE — ED Notes (Signed)
Pt with recent Hx thyroidectomy on 11-18-14, c/o tingling, numbness, concentrated on right side. Pt sent by PCP to have hypocalcemia evaluated. Pt states she "feels hot inside."

## 2014-11-23 NOTE — ED Notes (Signed)
Patient is alert and oriented x3.  She was given DC instructions and follow up visit instructions in spanish.  Patient gave verbal understanding that she is to follow up with her primary doctor in the morning. She was DC via wheelchair to home.  V/S stable.  He was not showing any signs of distress on DC

## 2014-11-25 ENCOUNTER — Other Ambulatory Visit: Payer: Self-pay | Admitting: Surgery

## 2014-11-25 LAB — CALCIUM: CALCIUM: 7.2 mg/dL — AB (ref 8.4–10.5)

## 2014-11-26 ENCOUNTER — Emergency Department (HOSPITAL_COMMUNITY): Payer: Self-pay

## 2014-11-26 ENCOUNTER — Emergency Department (HOSPITAL_COMMUNITY)
Admission: EM | Admit: 2014-11-26 | Discharge: 2014-11-27 | Disposition: A | Discharge status: Home / Self Care | Payer: Self-pay | Attending: Emergency Medicine | Admitting: Emergency Medicine

## 2014-11-26 ENCOUNTER — Encounter (HOSPITAL_COMMUNITY): Payer: Self-pay | Admitting: Emergency Medicine

## 2014-11-26 DIAGNOSIS — I10 Essential (primary) hypertension: Secondary | ICD-10-CM | POA: Insufficient documentation

## 2014-11-26 DIAGNOSIS — Z792 Long term (current) use of antibiotics: Secondary | ICD-10-CM | POA: Insufficient documentation

## 2014-11-26 DIAGNOSIS — Z8739 Personal history of other diseases of the musculoskeletal system and connective tissue: Secondary | ICD-10-CM | POA: Insufficient documentation

## 2014-11-26 DIAGNOSIS — R05 Cough: Secondary | ICD-10-CM | POA: Insufficient documentation

## 2014-11-26 DIAGNOSIS — Z79899 Other long term (current) drug therapy: Secondary | ICD-10-CM | POA: Insufficient documentation

## 2014-11-26 DIAGNOSIS — E039 Hypothyroidism, unspecified: Secondary | ICD-10-CM | POA: Insufficient documentation

## 2014-11-26 DIAGNOSIS — R059 Cough, unspecified: Secondary | ICD-10-CM

## 2014-11-26 DIAGNOSIS — K219 Gastro-esophageal reflux disease without esophagitis: Secondary | ICD-10-CM | POA: Insufficient documentation

## 2014-11-26 LAB — URINALYSIS, ROUTINE W REFLEX MICROSCOPIC
BILIRUBIN URINE: NEGATIVE
Glucose, UA: NEGATIVE mg/dL
Hgb urine dipstick: NEGATIVE
Ketones, ur: NEGATIVE mg/dL
LEUKOCYTES UA: NEGATIVE
Nitrite: NEGATIVE
Protein, ur: NEGATIVE mg/dL
Specific Gravity, Urine: 1.006 (ref 1.005–1.030)
Urobilinogen, UA: 0.2 mg/dL (ref 0.0–1.0)
pH: 7 (ref 5.0–8.0)

## 2014-11-26 LAB — CBC
HEMATOCRIT: 41.2 % (ref 36.0–46.0)
HEMOGLOBIN: 13.1 g/dL (ref 12.0–15.0)
MCH: 28.1 pg (ref 26.0–34.0)
MCHC: 31.8 g/dL (ref 30.0–36.0)
MCV: 88.2 fL (ref 78.0–100.0)
Platelets: 310 10*3/uL (ref 150–400)
RBC: 4.67 MIL/uL (ref 3.87–5.11)
RDW: 14.5 % (ref 11.5–15.5)
WBC: 6.1 10*3/uL (ref 4.0–10.5)

## 2014-11-26 LAB — COMPREHENSIVE METABOLIC PANEL
ALBUMIN: 3.6 g/dL (ref 3.5–5.2)
ALT: 30 U/L (ref 0–35)
ANION GAP: 11 (ref 5–15)
AST: 31 U/L (ref 0–37)
Alkaline Phosphatase: 56 U/L (ref 39–117)
BUN: 8 mg/dL (ref 6–23)
CALCIUM: 7.3 mg/dL — AB (ref 8.4–10.5)
CO2: 28 mmol/L (ref 19–32)
Chloride: 96 mmol/L (ref 96–112)
Creatinine, Ser: 0.55 mg/dL (ref 0.50–1.10)
GFR calc Af Amer: >90 mL/min (ref >90)
GFR calc non Af Amer: >90 mL/min (ref >90)
GLUCOSE: 106 mg/dL — AB (ref 70–99)
Potassium: 3.8 mmol/L (ref 3.5–5.1)
Sodium: 135 mmol/L (ref 135–145)
Total Bilirubin: 0.3 mg/dL (ref 0.3–1.2)
Total Protein: 6.5 g/dL (ref 6.0–8.3)

## 2014-11-26 MED ORDER — SODIUM CHLORIDE 0.9 % IV SOLN
1.0000 g | Freq: Once | INTRAVENOUS | Status: AC
  Administered 2014-11-26: 1 g via INTRAVENOUS
  Filled 2014-11-26: qty 10

## 2014-11-26 MED ORDER — BENZONATATE 100 MG PO CAPS: 200.0000 mg | ORAL_CAPSULE | Freq: Three times a day (TID) | ORAL | Status: DC | PRN

## 2014-11-26 MED ORDER — SODIUM CHLORIDE 0.9 % IV SOLN
INTRAVENOUS | Status: DC
  Administered 2014-11-26: 20 mL/h via INTRAVENOUS

## 2014-11-26 MED ORDER — LORAZEPAM 2 MG/ML IJ SOLN
0.5000 mg | Freq: Once | INTRAMUSCULAR | Status: AC
  Administered 2014-11-26: 0.5 mg via INTRAVENOUS
  Filled 2014-11-26: qty 1

## 2014-11-26 NOTE — ED Provider Notes (Signed)
CSN: [id number]     Arrival date & time 11/26/14  1945 History   First MD Initiated Contact with Patient 11/26/14 2152     Chief Complaint  Patient presents with  . Hypocalcemia      (Consider location/radiation/quality/duration/timing/severity/associated sxs/prior Treatment) HPI Comments: Patient here due to decreased calcium. Blood level yesterday was 7.2. The day before that it was 6.3. Seen in ED for similar symptoms and patient was to be managed by her general surgeon. She recently had a thyroid gland out. Complains of having lower extremity weakness without muscle twitching. She also complains of nonproductive cough which induces pain in her throat. She denies any trouble swallowing. No trouble laying flat. Symptoms persistent. Nothing makes them better.  The history is provided by the patient.    Past Medical History  Diagnosis Date  . Hypothyroidism   . Hypertension   . Varicose vein of leg   . Osteoarthritis   . GERD (gastroesophageal reflux disease)     occasional prilosec   Past Surgical History  Procedure Laterality Date  . Hip surgery  2008-9    right  . Hip surgery  2014    left  . Joint replacement      bil total hips  . Thyroidectomy N/A 11/18/2014    Procedure: TOTAL THYROIDECTOMY;  Surgeon: Darnell Level, MD;  Location: [facility];  Service: General;  Laterality: N/A;   Family History  Problem Relation Age of Onset  . High blood pressure     History  Substance Use Topics  . Smoking status: Never Smoker   . Smokeless tobacco: Never Used  . Alcohol Use: Yes   OB History    No data available     Review of Systems  All other systems reviewed and are negative.     Allergies  Review of patient's allergies indicates no known allergies.  Home Medications   Prior to Admission medications   Medication Sig Start Date End Date Taking? Authorizing Provider  calcium-vitamin D (OSCAL WITH D) 500-200 MG-UNIT per tablet Take 2 tablets by mouth 3 (three) times  daily. 11/19/14  Yes Darnell Level, MD  metoprolol (LOPRESSOR) 50 MG tablet Take 25 mg by mouth every morning.   Yes Historical Provider, MD  omeprazole (PRILOSEC) 40 MG capsule Take 40 mg by mouth daily.   Yes Historical Provider, MD  oxyCODONE (OXY IR/ROXICODONE) 5 MG immediate release tablet Take 1-2 tablets (5-10 mg total) by mouth every 6 (six) hours as needed for moderate pain. 11/19/14  Yes Darnell Level, MD  SYNTHROID 88 MCG tablet Take 1 tablet (88 mcg total) by mouth daily before breakfast. 11/19/14  Yes Darnell Level, MD  ciprofloxacin (CIPRO) 250 MG tablet Take 1 tablet (250 mg total) by mouth 2 (two) times daily. Patient not taking: Reported on 11/09/2014 06/10/14   Sandford Craze, NP  metoprolol (LOPRESSOR) 50 MG tablet Take 1/4 tablet by mouth once daily. Patient not taking: Reported on 11/09/2014 06/02/14   Sandford Craze, NP   BP 123/80 mmHg  Pulse 78  Temp(Src) 98.1 F (36.7 C) (Oral)  Resp 20  SpO2 98% Physical Exam  Constitutional: She is oriented to person, place, and time. She appears well-developed and well-nourished.  Non-toxic appearance. No distress.  HENT:  Head: Normocephalic and atraumatic.  Eyes: Conjunctivae, EOM and lids are normal. Pupils are equal, round, and reactive to light.  Neck: Normal range of motion. Neck supple. No tracheal deviation present. No thyroid mass present.    Cardiovascular: Normal  rate, regular rhythm and normal heart sounds.  Exam reveals no gallop.   No murmur heard. Pulmonary/Chest: Effort normal. No stridor. No respiratory distress. She has decreased breath sounds. She has no wheezes. She has no rhonchi. She has no rales.  Abdominal: Soft. Normal appearance and bowel sounds are normal. She exhibits no distension. There is no tenderness. There is no rebound and no CVA tenderness.  Musculoskeletal: Normal range of motion. She exhibits no edema or tenderness.  Neurological: She is alert and oriented to person, place, and time. She  displays no tremor. No cranial nerve deficit or sensory deficit. GCS eye subscore is 4. GCS verbal subscore is 5. GCS motor subscore is 6.  No clonus noted.  Skin: Skin is warm and dry. No abrasion and no rash noted.  Psychiatric: She has a normal mood and affect. Her speech is normal and behavior is normal.  Nursing note and vitals reviewed.   ED Course  Procedures (including critical care time) Labs Review Labs Reviewed  CBC  COMPREHENSIVE METABOLIC PANEL  URINALYSIS, ROUTINE W REFLEX MICROSCOPIC    Imaging Review No results found.   EKG Interpretation   Date/Time:  Friday November 26 2014 20:58:46 EST Ventricular Rate:  79 PR Interval:  176 QRS Duration: 88 QT Interval:  532 QTC Calculation: 610 R Axis:   -47 Text Interpretation:  Sinus rhythm Left axis deviation Anteroseptal  infarct, old Borderline repolarization abnormality Prolonged QT interval  No significant change since last tracing Confirmed by KNAPP  MD-J, JON  [id number]) on 11/26/2014 9:03:31 PM      MDM   Final diagnoses:  Cough   Patient's calcium has been improving when compared to prior studies. It is 7.3 today and was 7.2 yesterday. She was given 1 g of calcium chloride IV piggyback. Leg pain has improved. She did have a cough and a chest x-ray was negative for infection. I will treat the patient's cough and she'll be discharged home     Toy BakerAnthony T Macy Lingenfelter, MD 11/26/14 2339

## 2014-11-26 NOTE — ED Notes (Signed)
Patient was seen at the PCP yesterday and told her calcium was low but was not told the exact calcium level. Also c/o posterior headache since last night. Moving all extremities equally. No muscle deficit noted. No muscle twitching or stiffness noted. Reports, "I feel like my legs are really heavy and big when I walk." No other issues/complaints. Speaking full/clear sentences. RR even/unlabored.

## 2014-11-26 NOTE — Discharge Instructions (Signed)
volver aqu para cualquier dificultad para tragar ,debilidad, respirar, o cualquier otro problema . seguimiento con su mdico el lunes para una repeticin de calcio  Recomendaciones para la ingesta de calcio  (Calcium Intake Recommendations) El calcio en nuestra sangre es importante para el control de varios factores, por ejemplo:   La coagulacin de Risk managerla sangre.  La conduccin de los General Millsimpulsos nerviosos.  La contraccin muscular.  El mantenimiento de la salud de los dientes y [place]los huesos.  Otras funciones del cuerpo. Grupo etario / Cantidad de calcio a consumir diariamente, en miligramos (mg)  Desde el nacimiento a los 6 meses / 200 mg  Bebs 7 a 12 meses / 260 mg  Nios de 1 a 3 aos / 700 mg  Nios de 4 a 8 aos / 1000 mg  Nios de 9 a 13 Aos / 1300 mg  Adolescentes de 14 a 18 Aos / 1300 mg  Adultos de 19 a 50 Aos / 1000 mg  Mujeres adultas de 51 a 70 Aos / 1200 mg  Adultos de 71 aos y ms / 1200 mg  Adolescentes embarazadas y que amamantan / 1300 mg  Mujeres adultas embarazadas y que [place]amamantan / 1000 mg Document Released: 10/01/2012 Document Revised: 02/09/2013 ExitCare Patient Information 2015 [place]ExitCare, MarylandLLC. This information is not intended to replace advice given to you by your health care provider. Make sure you discuss any questions you have with your health care provider.  Tos - Adultos  (Cough, Adult)  La tos es un reflejo que ayuda a limpiar las vas areas y Administratorla garganta. Puede ayudar a curar el organismo o ser Neomia Dearuna reaccin a un irritante. La tos puede durar General Electricentre 2  3 semanas (aguda) o puede durar ms de 8 semanas (crnica)  CAUSAS  Tos aguda:   Infecciones virales o bacterianas. Tos crnica.   Infecciones.  Alergias.  Asma.  Goteo post nasal.  El hbito de fumar.  Acidez o reflujo gstrico.  Algunos medicamentos.  Problemas pulmonares crnicos  Cncer. SNTOMAS   Tos.  Grant RutsFiebre.  Dolor en el pecho.  Aumento en el ritmo  respiratorio.  Ruidos agudos al respirar (sibilancias).  Moco coloreado al toser (esputo). TRATAMIENTO   Un tos de causa bacteriana puede tratarse con antibiticos.  La tos de origen viral debe seguir su curso y no responde a los antibiticos.  El mdico podr recomendar otros tratamientos si tiene tos crnica. INSTRUCCIONES PARA EL CUIDADO EN EL HOGAR   Solo tome medicamentos que se pueden comprar sin receta o recetados para Chief Technology Officerel dolor, Dentistmalestar o fiebre, como le indica el mdico. Utilice antitusivos slo en la forma indicada por el mdico.  Use un vaporizador o humidificador de niebla fra en la habitacin para ayudar a aflojar las secreciones.  Duerma en posicin semi erguida si la tos empeora por la noche.  Descanse todo lo que pueda.  Si fuma, abandone el hbito. SOLICITE ATENCIN MDICA DE INMEDIATO SI:   Observa pus en el esputo.  La tos empeora.  No puede controlar la tos con antitusivos y no puede dormir debido a Secretary/administratorello.  Comienza a escupir sangre al toser.  Tiene dificultad para respirar.  El dolor empeora o no puede controlarlo con los medicamentos.  Tiene fiebre. ASEGRESE DE QUE:   Comprende estas instrucciones.  Controlar su enfermedad.  Solicitar ayuda de inmediato si no mejora o si empeora. Document Released: 05/23/2011 Document Revised: 01/07/2012 [facility] Patient Information 2015 [place]ExitCare, MarylandLLC. This information is not intended to replace advice  given to you by your health care provider. Make sure you discuss any questions you have with your health care provider.

## 2014-11-28 ENCOUNTER — Telehealth: Payer: Self-pay | Admitting: Family

## 2014-11-28 NOTE — Telephone Encounter (Signed)
Please contact pt and make sure that she has follow up with her surgeon early this week.

## 2014-11-29 ENCOUNTER — Other Ambulatory Visit: Payer: Self-pay | Admitting: Surgery

## 2014-11-29 LAB — CALCIUM: CALCIUM: 7.6 mg/dL — AB (ref 8.4–10.5)

## 2014-11-29 NOTE — Telephone Encounter (Signed)
Left message with Reuel Boomaniel.  He states he will talk with pt's son and call back with below information as he isn't sure if pt has appt scheduled yet.

## 2014-11-29 NOTE — Telephone Encounter (Signed)
Pt's son is out of town. Spoke with Aram BeechamCynthia (pt's granddaughter) and she states pt has appt with surgeon on 12/16/14. States pt has had a cough since 2 days after thyroid surgery. Cough is productive with yellow phlegm. Reports that pt has had xrays at hospital and wanted to know if we could call in medication. Advised her that pt would need evaluation in the office first to determine if medication should be prescribed. Scheduled appt with Malva Coganody Martin, PA for 11/30/14 at 3:15pm. Also reports that pt has been getting IVs to build up her calcium levels.  Please advise is any other recommendations?

## 2014-11-29 NOTE — Telephone Encounter (Signed)
Agree, no other recommendations at this time.

## 2014-11-30 ENCOUNTER — Ambulatory Visit: Payer: Self-pay | Admitting: Physician Assistant

## 2014-11-30 ENCOUNTER — Telehealth: Payer: Self-pay | Admitting: *Deleted

## 2014-11-30 NOTE — Telephone Encounter (Signed)
Appointment was made by Nicki Guadalajararicia 11/29/14 at 4:38pm (yesterday), so calls to confirm appointments for today had already been made.

## 2014-11-30 NOTE — Telephone Encounter (Signed)
Translator for patient arrived at office for 3:15p appointment, when pt did not show in time frame allotted, translator left and said to have pt call and rescheduled with new appointment. LMOM with contact name and number for return call RE: translator message and missed appointment [reschedule ED Follow-up with PCP]/SLS

## 2014-11-30 NOTE — Telephone Encounter (Signed)
No charge -- Was her appointment confirmed since translator was present?

## 2014-11-30 NOTE — Telephone Encounter (Signed)
Pt did not show for appointment 11/30/2014 at 3:15pm for "cough--tf,cma/ED FOLLOW-UP/sls".  Interpreter was here for her.  Charge no show fee?

## 2014-12-01 NOTE — Telephone Encounter (Signed)
See note below regarding no show charge 

## 2014-12-06 ENCOUNTER — Ambulatory Visit (HOSPITAL_BASED_OUTPATIENT_CLINIC_OR_DEPARTMENT_OTHER)
Admission: RE | Admit: 2014-12-06 | Discharge: 2014-12-06 | Disposition: A | Discharge status: Home / Self Care | Payer: Self-pay | Source: Ambulatory Visit | Attending: Family | Admitting: Family

## 2014-12-06 ENCOUNTER — Encounter: Payer: Self-pay | Admitting: Family

## 2014-12-06 ENCOUNTER — Ambulatory Visit (INDEPENDENT_AMBULATORY_CARE_PROVIDER_SITE_OTHER): Payer: Self-pay | Admitting: Family

## 2014-12-06 VITALS — BP 132/80 | HR 77 | Temp 98.0°F | Resp 14 | Ht 64.0 in | Wt 279.0 lb

## 2014-12-06 DIAGNOSIS — R05 Cough: Secondary | ICD-10-CM | POA: Insufficient documentation

## 2014-12-06 DIAGNOSIS — M160 Bilateral primary osteoarthritis of hip: Secondary | ICD-10-CM

## 2014-12-06 DIAGNOSIS — R059 Cough, unspecified: Secondary | ICD-10-CM

## 2014-12-06 DIAGNOSIS — R609 Edema, unspecified: Secondary | ICD-10-CM

## 2014-12-06 DIAGNOSIS — I83893 Varicose veins of bilateral lower extremities with other complications: Secondary | ICD-10-CM | POA: Insufficient documentation

## 2014-12-06 MED ORDER — GUAIFENESIN-CODEINE 100-10 MG/5ML PO SYRP: 5.0000 mL | ORAL_SOLUTION | Freq: Three times a day (TID) | ORAL | Status: DC | PRN

## 2014-12-06 NOTE — Assessment & Plan Note (Signed)
Likely cause for hip pain, advised tylenol prn.

## 2014-12-06 NOTE — Progress Notes (Signed)
Pre visit review using our clinic review tool, if applicable. No additional management support is needed unless otherwise documented below in the visit note. 

## 2014-12-06 NOTE — Patient Instructions (Addendum)
You may use cheratussin as needed for cough.   Complete chest x ray and leg ultrasound on the first floor. You may use tylenol as needed for hip pain. Call if symptoms worsen or if not improved in 1 week.  Please make sure that you have a follow up appointment with Dr. Gerrit FriendsGerkin.

## 2014-12-06 NOTE — Addendum Note (Signed)
Addended by: Sandford Craze'SULLIVAN, Aleighya Mcanelly on: 12/06/2014 01:30 PM   Modules accepted: Kipp BroodSmartSet

## 2014-12-06 NOTE — Progress Notes (Signed)
Subjective:    Patient ID: Shannon [name], female    DOB: 1951-10-17, 64 y.o.   MRN: [id number]  HPI Shannon [name] is here today for several acute problems. She is accompanied by a spanish intepretor and family member today.  1. Cough: has had cough productive of yellow phlegm since thyroidectomy 1/21 so severe that it causes her chest to hurt. Reports wheezing. Denies sinus pressure, fever, sneezing, runny nose. Reports headaches in am, fatigue, loss of appetite. Has tried   cold medicine but she doesn't know what type.   2. Reports constant pain in bilateral hips made worse by coughing and walking. Denies numbness/tingling/pain that radiates down legs.  3. Reports ankle and lower leg swelling that started two days ago with some erythema in right lower leg. Swelling is greater in rt leg than left. She reports varicose veins in both legs.    Review of Systems See HPI  Past Medical History  Diagnosis Date  . Hypothyroidism   . Hypertension   . Varicose vein of leg   . Osteoarthritis   . GERD (gastroesophageal reflux disease)     occasional prilosec    History   Social History  . Marital Status: Widowed    Spouse Name: N/A    Number of Children: N/A  . Years of Education: N/A   Occupational History  . Not on file.   Social History Main Topics  . Smoking status: Never Smoker   . Smokeless tobacco: Never Used  . Alcohol Use: Yes  . Drug Use: No  . Sexual Activity: No   Other Topics Concern  . Not on file   Social History Narrative   Ten children- 4 liven in [place], 6 in [place].  13 grandchildren, 5 great grand children   From [place]   She has lived here for 17 years   She has not worked recently due to hip problems.  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 Hip surgery  2008-9    right  . Hip surgery    2014    left   . Joint replacement      bil total hips  . Thyroidectomy N/A 11/18/2014    Procedure: TOTAL THYROIDECTOMY;  Surgeon: Darnell Level, MD;  Location: [facility];  Service: General;  Laterality: N/A;    Family History  Problem Relation Age of Onset  . High blood pressure      No Known Allergies  Current Outpatient Prescriptions on File Prior to Visit  Medication Sig Dispense Refill  . calcium-vitamin D (OSCAL WITH D) 500-200 MG-UNIT per tablet Take 2 tablets by mouth 3 (three) times daily. 60 tablet 1  . ciprofloxacin (CIPRO) 250 MG tablet Take 1 tablet (250 mg total) by mouth 2 (two) times daily. 6 tablet 0  . metoprolol (LOPRESSOR) 50 MG tablet Take 1/4 tablet by mouth once daily. 30 tablet 2  . omeprazole (PRILOSEC) 40 MG capsule Take 40 mg by mouth daily.    Marland Kitchen SYNTHROID 88 MCG tablet Take 1 tablet (88 mcg total) by mouth daily before breakfast. 30 tablet 3   No current facility-administered medications on file prior to visit.    BP 132/80 mmHg  Pulse 77  Temp(Src) 98 F (36.7 C) (Oral)  Resp 14  Ht 5'  4" (1.626 m)  Wt 279 lb (126.554 kg)  BMI 47.87 kg/m2  SpO2 98%       Objective:   Physical Exam  Constitutional: She is oriented to person, place, and time. She appears well-developed and well-nourished. No distress.  HENT:  Head: Normocephalic and atraumatic.  Neck:  Anterior neck incision is clean dry and intact.  Steri strips in place.   Cardiovascular: Normal rate and regular rhythm.   No murmur heard. Pulmonary/Chest: Effort normal and breath sounds normal. No respiratory distress. She has no wheezes. She has no rales. She exhibits no tenderness.  Musculoskeletal:  1+ RLE swelling 2+ LLE swelling with tortuous varicose vein noted left caft  Neurological: She is alert and oriented to person, place, and time.  Skin: Skin is warm and dry.  Psychiatric: She has a normal mood and affect. Her behavior is normal. Judgment and thought content normal.           Assessment & Plan:  Cough- will obtain CXR to rule out pneumonia, rx provided for cheratussion.    Hypocalcemia- this is being managed by her surgeon and we confirmed that she has a follow up appointment with them on 2/18.   Edema- need to rule out DVT given recent surgery.

## 2014-12-07 ENCOUNTER — Telehealth: Payer: Self-pay | Admitting: Family

## 2014-12-07 MED ORDER — FUROSEMIDE 20 MG PO TABS: 20.0000 mg | ORAL_TABLET | Freq: Every day | ORAL | Status: DC

## 2014-12-07 NOTE — Telephone Encounter (Signed)
Patient's daughter called back regarding lab results.   Daughter notified that her chest x-ray showed some fluid and to start the Lasix once daily.  Stated understanding.   Follow-up appointment scheduled for 12/20/14.  eal

## 2014-12-07 NOTE — Telephone Encounter (Signed)
Left message for pt's family to return my call.

## 2014-12-07 NOTE — Telephone Encounter (Signed)
CXR shows some extra fluid on her lungs. US negative for clot. To help with fluid on lungs and swelling, I would like her to add lasix once daily. Follow up in 2 weeks.

## 2014-12-20 ENCOUNTER — Ambulatory Visit: Payer: Self-pay | Admitting: Family

## 2014-12-21 ENCOUNTER — Telehealth: Payer: Self-pay | Admitting: Family

## 2014-12-21 NOTE — Telephone Encounter (Signed)
Caller name: Audelia HivesRamirez-Camacho, Teresa Relation to pt: self  Call back number: [phone] [facility] [place] [address] [phone] (Phone) [phone] (Fax)         Reason for call:  Pt is completely out of metoprolol (LOPRESSOR) 50 MG tablet requesting refill

## 2014-12-22 ENCOUNTER — Telehealth: Payer: Self-pay | Admitting: Family

## 2014-12-22 ENCOUNTER — Encounter: Payer: Self-pay | Admitting: Family

## 2014-12-22 MED ORDER — METOPROLOL TARTRATE 50 MG PO TABS: ORAL_TABLET | ORAL | Status: DC

## 2014-12-22 NOTE — Telephone Encounter (Signed)
Yes, please charge no show fee.  I have asked tricia to send a no show letter. Thanks.

## 2014-12-22 NOTE — Telephone Encounter (Signed)
Refill sent and letter mailed.

## 2014-12-22 NOTE — Telephone Encounter (Signed)
Please send no show letter requesting pt to schedule follow up.

## 2014-12-22 NOTE — Telephone Encounter (Signed)
Shannon [name]-- pt last seen by us on 12/06/14 and noted to have fluid on her lungs. Pt was supposed to have seen us for f/u on 12/20/14 and no showed.   Please advise.

## 2014-12-22 NOTE — Telephone Encounter (Signed)
PT no show 2/22 for follow up- do we need to reschedule? Do you want a no show fee charged?

## 2014-12-22 NOTE — Telephone Encounter (Signed)
I have already sent the no show letter- will forward charge to [place]az[place]

## 2015-01-04 ENCOUNTER — Ambulatory Visit: Payer: Self-pay | Admitting: Internal Medicine

## 2015-01-05 ENCOUNTER — Ambulatory Visit (INDEPENDENT_AMBULATORY_CARE_PROVIDER_SITE_OTHER): Payer: Self-pay | Admitting: Family

## 2015-01-05 ENCOUNTER — Encounter: Payer: Self-pay | Admitting: Family

## 2015-01-05 VITALS — BP 136/90 | HR 62 | Temp 98.0°F | Resp 16 | Ht 64.0 in | Wt 276.0 lb

## 2015-01-05 DIAGNOSIS — R609 Edema, unspecified: Secondary | ICD-10-CM

## 2015-01-05 DIAGNOSIS — K219 Gastro-esophageal reflux disease without esophagitis: Secondary | ICD-10-CM

## 2015-01-05 LAB — BASIC METABOLIC PANEL
BUN: 17 mg/dL (ref 6–23)
CO2: 34 meq/L — AB (ref 19–32)
Calcium: 8.5 mg/dL (ref 8.4–10.5)
Chloride: 97 mEq/L (ref 96–112)
Creatinine, Ser: 0.64 mg/dL (ref 0.40–1.20)
GFR: 99.3 mL/min (ref >60.00)
GLUCOSE: 90 mg/dL (ref 70–99)
POTASSIUM: 3.6 meq/L (ref 3.5–5.1)
SODIUM: 137 meq/L (ref 135–145)

## 2015-01-05 MED ORDER — FUROSEMIDE 20 MG PO TABS: 20.0000 mg | ORAL_TABLET | Freq: Every day | ORAL | Status: DC

## 2015-01-05 MED ORDER — RANITIDINE HCL 150 MG PO TABS: 150.0000 mg | ORAL_TABLET | Freq: Two times a day (BID) | ORAL | Status: DC

## 2015-01-05 NOTE — Patient Instructions (Addendum)
Stop omeprazole (prilosec), instead start ranitidine (zantac) twice daily for reflux.  I am hopeful that this will help with your cough. Continue lasix. Please continue your lab work prior to leaving.  Please contact the [facility] Billing office to try to apply for patient assistance:  [phone] Go to the ER if you develop chest pain.  Follow up in 3 months.

## 2015-01-05 NOTE — Progress Notes (Signed)
Pre visit review using our clinic review tool, if applicable. No additional management support is needed unless otherwise documented below in the visit note. 

## 2015-01-05 NOTE — Progress Notes (Signed)
Subjective:    Patient ID: Shannon [name], female    DOB: Aug 04, 1951, 64 y.o.   MRN: [id number]  HPI  Pt presents today for follow up.She is accompanied by an intepreter. Last month she report Cough. She had LE edema. CXR showed pulmonary vascular congestion. Lasix was started.  She does have DOE.  Denies chest pain. DOE is improved than before.  She buys omeprazole otc but it is too expensive.   Wt Readings from Last 3 Encounters:  01/05/15 276 lb (125.193 kg)  12/06/14 279 lb (126.554 kg)  11/18/14 270 lb (122.471 kg)     Review of Systems See HPI  Past Medical History  Diagnosis Date  . Hypothyroidism   . Hypertension   . Varicose vein of leg   . Osteoarthritis   . GERD (gastroesophageal reflux disease)     occasional prilosec    History   Social History  . Marital Status: Widowed    Spouse Name: N/A  . Number of Children: N/A  . Years of Education: N/A   Occupational History  . Not on file.   Social History Main Topics  . Smoking status: Never Smoker   . Smokeless tobacco: Never Used  . Alcohol Use: Yes  . Drug Use: No  . Sexual Activity: No   Other Topics Concern  . Not on file   Social History Narrative   Ten children- 4 liven in [place], 6 in [place].  13 grandchildren, 5 great grand children   From [place]   She has lived here for 17 years   She has not worked recently due to hip problems.  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 Hip surgery  2008-9    right  . Hip surgery   2014    left   . Joint replacement      bil total hips  . Thyroidectomy N/A 11/18/2014    Procedure: TOTAL THYROIDECTOMY;  Surgeon: Darnell Level, MD;  Location: [facility];  Service: General;  Laterality: N/A;    Family History  Problem Relation Age of Onset  . High blood pressure      No Known Allergies  Current  Outpatient Prescriptions on File Prior to Visit  Medication Sig Dispense Refill  . calcium-vitamin D (OSCAL WITH D) 500-200 MG-UNIT per tablet Take 2 tablets by mouth 3 (three) times daily. 60 tablet 1  . metoprolol (LOPRESSOR) 50 MG tablet Take 1/4 tablet by mouth once daily. 30 tablet 0  . SYNTHROID 88 MCG tablet Take 1 tablet (88 mcg total) by mouth daily before breakfast. 30 tablet 3  . omeprazole (PRILOSEC) 40 MG capsule Take 40 mg by mouth daily as needed.      No current facility-administered medications on file prior to visit.    BP 136/90 mmHg  Pulse 62  Temp(Src) 98 F (36.7 C) (Oral)  Resp 16  Ht  (1.626 m)  Wt 276 lb (125.193 kg)  BMI 47.35 kg/m2  SpO2 98%       Objective:   Physical Exam  Constitutional: She appears well-developed and well-nourished.  Cardiovascular: Normal rate, regular rhythm and normal heart sounds.   No murmur heard. Pulmonary/Chest: Effort normal and breath sounds normal. No respiratory distress. She has no wheezes.  Musculoskeletal: She exhibits  no edema.  Skin: Skin is warm and dry.  Psychiatric: She has a normal mood and affect. Her behavior is normal. Judgment and thought content normal.          Assessment & Plan:

## 2015-01-06 DIAGNOSIS — R609 Edema, unspecified: Secondary | ICD-10-CM | POA: Insufficient documentation

## 2015-01-06 NOTE — Assessment & Plan Note (Signed)
D/c omeprazole due to cost, trial of ranitidine.

## 2015-01-06 NOTE — Assessment & Plan Note (Signed)
Improved. Continue lasix, obtain bmet to assess electrolytes.  I would really like to get a 2D echo but she is unable to afford it.  I have given her the number for the business office at [facility] for her to apply for pt assistance.

## 2015-01-17 ENCOUNTER — Encounter: Payer: Self-pay | Admitting: Internal Medicine

## 2015-01-17 ENCOUNTER — Ambulatory Visit (INDEPENDENT_AMBULATORY_CARE_PROVIDER_SITE_OTHER): Payer: Self-pay | Admitting: Internal Medicine

## 2015-01-17 VITALS — BP 124/90 | HR 77 | Temp 98.1°F | Ht 64.0 in | Wt 275.2 lb

## 2015-01-17 DIAGNOSIS — E890 Postprocedural hypothyroidism: Secondary | ICD-10-CM

## 2015-01-17 MED ORDER — LEVOTHYROXINE SODIUM 88 MCG PO TABS: 88.0000 ug | ORAL_TABLET | Freq: Every day | ORAL | Status: DC

## 2015-01-17 NOTE — Progress Notes (Signed)
Patient ID: Fanta Wimberley, female   DOB: 05-22-1951, 64 y.o.   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  Reviewed hx: Pt started to hear SOB, wheezing, coughing >> went to ED on 05/09/2014 >> CXR showed: The trachea appeared mildly narrowed due to marked enlargement of the thyroid gland but the appearance is unchanged.  Thyroid U/S (06/02/2014):  Large MNG, but nodules not well defined to necessitate Bx.  At last visit, we checked check a flow-loop curve (to check for Th compression) in the office >> mild airway obstruction >> I also d/w pulmonologist, who also suggested Sx  She had total thyroidectomy 11/18/2014 (Dr. Gerrit Friends).  She has much less SOB after her thyroidectomy. No more wheezing and cough. Overall she feels much better!  She is now on 88 mcg Synthroid DAW, taken: - with water or coffee + milk or oatmeal - in am - along with all the other meds, including calcium - + Zantac with lunch (prn) - + calcium (had low calcium postop) - now taking 3 tabs 3x a day (decreased from 4 tabs 3x a day ~ 1 mo ago)  Lab Results  Component Value Date   CALCIUM 8.5 01/05/2015   CALCIUM 7.6* 11/29/2014   CALCIUM 7.3* 11/26/2014   CALCIUM 7.2* 11/25/2014  - no PPI, MVI, iron. She goes back to see Dr Gerrit Friends in April.  I reviewed pt's thyroid tests: Lab Results  Component Value Date   TSH 0.401 05/09/2014   TSH 0.426 09/02/2012   FREET4 1.40 05/09/2014   Pt denies: - heat intolerance/cold intolerance - tremors - palpitations - anxiety/depression - hyperdefecation/constipation - weight loss - weight gain - dry skin - hair falling - fatigue  ROS: Constitutional: no weight gain/loss, no fatigue, no subjective hyperthermia/hypothermia Eyes: no blurry vision, no xerophthalmia ENT: no sore throat, no nodules palpated in  throat, no dysphagia/odynophagia, no hoarseness Cardiovascular: no CP/+ SOB/palpitations/leg swelling Respiratory: no cough/+ SOB/wheezing Gastrointestinal: no N/V/D/C Musculoskeletal: no muscle/joint aches Skin: no rashes Neurological: no tremors/numbness/tingling/dizziness, no HA  I reviewed pt's medications, allergies, PMH, social hx, family hx, and changes were documented in the history of present illness. Otherwise, unchanged from my initial visit note:  Past Medical History  Diagnosis Date  . Hypothyroidism   . Hypertension   . Varicose vein of leg   . Osteoarthritis   . GERD (gastroesophageal reflux disease)     occasional prilosec   Past Surgical History  Procedure Laterality Date  . Hip surgery  2008-9    right  . Hip surgery   2014    left   . Joint replacement      bil total hips  . Thyroidectomy N/A 11/18/2014    Procedure: TOTAL THYROIDECTOMY;  Surgeon: Darnell Level, MD;  Location: [facility];  Service: General;  Laterality: N/A;   History   Social History Main Topics  . Smoking status: Never Smoker   . Smokeless tobacco: Never Used  . Alcohol Use: No  . Drug Use: No   Social History Narrative   10 children- 4 liven in [place], 6 in [place].  13 grandchildren, 5 great grand children   From [place]   She has lived here for 17 years   She has not worked recently due to hip problems.  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 calcium-vitamin D (OSCAL WITH D) 500-200 MG-UNIT per tablet Take 2 tablets by mouth 3 (three) times daily. 60 tablet 1  . furosemide (LASIX) 20 MG tablet Take 1 tablet (20 mg total) by mouth daily. 30 tablet 2  . metoprolol (LOPRESSOR) 50 MG tablet Take 1/4 tablet by mouth once daily. 30 tablet 0  . omeprazole (PRILOSEC) 40 MG capsule Take 40 mg by mouth daily as needed.     .  ranitidine (ZANTAC) 150 MG tablet Take 1 tablet (150 mg total) by mouth 2 (two) times daily. 60 tablet 2  . SYNTHROID 88 MCG tablet Take 1 tablet (88 mcg total) by mouth daily before breakfast. 30 tablet 3   No current facility-administered medications on file prior to visit.   No Known Allergies Family History  Problem Relation Age of Onset  . High blood pressure     PE: BP 124/90 mmHg  Pulse 77  Temp(Src) 98.1 F (36.7 C) (Oral)  Ht  (1.626 m)  Wt 275 lb 3.2 oz (124.83 kg)  BMI 47.21 kg/m2 Wt Readings from Last 3 Encounters:  01/17/15 275 lb 3.2 oz (124.83 kg)  01/05/15 276 lb (125.193 kg)  12/06/14 279 lb (126.554 kg)   Constitutional: overweight, in NAD, full supraclavicular fat pads Eyes: PERRLA, EOMI, no exophthalmos ENT: moist mucous membranes, + thyroidectomy scar healing, no hypersensitivity or pain at the scar site, no cervical lymphadenopathy Cardiovascular: RRR, No MRG Respiratory: CTA B Gastrointestinal: abdomen soft, NT, ND, BS+ Musculoskeletal: no deformities, strength intact in all 4;  Skin: moist, warm, no rashes Neurological: no tremor with outstretched hands, DTR normal in all 4  ASSESSMENT: 1. MNG Thyroid U/S (06/02/2014):   Right thyroid lobe Measurements: 7.6 x 3.3 x 2.8 cm.   Left thyroid lobe Measurements: 10.5 x 3.9 x 4.9 cm.   Isthmus Thickness: 1.1 cm.   The entire thyroid gland is significantly enlarged and demonstrates  multiple ill-defined areas of nodularity and overall very  heterogeneous echotexture. Several areas were measured by the  sonographer but do not appear to represent discrete nodules.  Instead, the entire gland is nodular. The only discrete nodule is a  hypoechoic nodule in the superior left lobe measuring 1.3 x 1.8 x  1.3 cm containing areas of shadowing central calcification.   Lymphadenopathy: None visualized.   11/18/2014: Total thyroidectomy >> benign pathology  2. Postsurgical hypothyroidism  3.  Postsurgical hypocalcemia  PLAN: 1. And 2. Postsurgical hypothyroidism after total thyroidectomy for compressing MNG - I reviewed the path report along with the patient and her daughter in law >> benign - she is relieved that her goiter was not cancerous - she feels much better >> less SOB, no more cough or wheezing - she is not taking the LT4 correctly - takes it with b'fast or coffee + milk and along with calcium and ranitidine.: - I advised her to take the thyroid hormone every day, with water, >30 minutes before breakfast, separated by >4 hours from acid reflux medications, calcium, iron, multivitamins. - will not check TSH now >> will start taking it correctly and come to the lab in 4-5 weeks - refilled LT4 (switched to generic)  3  Postsurgical hypocalcemia - last value normal, on 3 tabs 3x a day -  will decrease to 2 tabs 3x a day and move the doses with Lunch, [employer]  and Bedtime - she will have a new level checked by Dr Gerrit FriendsGerkin next month >> hopefully we can decrease the dose further and then stop  Patient Instructions  Take the thyroid hormone every day, with water, >30 minutes before breakfast, separated by >4 hours from acid reflux medications, calcium, iron, multivitamins.  Move Zantac with lunch.  Decrease calcium to 2 tablets 3x a day. Move calcium with lunch, dinner and bedtime.  Please come back for a follow-up appointment in 4 months.  Please make a lab appt in 4-5 weeks.   Contact: Daughter in law: [phone] (preferred, [place])

## 2015-01-17 NOTE — Patient Instructions (Addendum)
Take the thyroid hormone every day, with water, >30 minutes before breakfast, separated by >4 hours from acid reflux medications, calcium, iron, multivitamins.  Move Zantac with lunch.  Decrease calcium to 2 tablets 3x a day. Move calcium with lunch, dinner and bedtime.  Please come back for a follow-up appointment in 4 months.  Please make a lab appt in 4-5 weeks.

## 2015-02-14 ENCOUNTER — Other Ambulatory Visit (INDEPENDENT_AMBULATORY_CARE_PROVIDER_SITE_OTHER): Payer: Self-pay

## 2015-02-14 DIAGNOSIS — E890 Postprocedural hypothyroidism: Secondary | ICD-10-CM

## 2015-02-14 LAB — T4, FREE: FREE T4: 0.91 ng/dL (ref 0.60–1.60)

## 2015-02-14 LAB — TSH: TSH: 44.65 u[IU]/mL — AB (ref 0.35–4.50)

## 2015-02-16 ENCOUNTER — Encounter: Payer: Self-pay | Admitting: *Deleted

## 2015-02-16 ENCOUNTER — Other Ambulatory Visit: Payer: Self-pay | Admitting: *Deleted

## 2015-02-16 MED ORDER — LEVOTHYROXINE SODIUM 100 MCG PO TABS: 100.0000 ug | ORAL_TABLET | Freq: Every day | ORAL | Status: DC

## 2015-04-13 ENCOUNTER — Ambulatory Visit: Payer: Self-pay | Admitting: Family

## 2015-05-19 ENCOUNTER — Ambulatory Visit: Payer: Self-pay | Admitting: Internal Medicine

## 2015-06-15 ENCOUNTER — Other Ambulatory Visit: Payer: Self-pay | Admitting: *Deleted

## 2015-06-15 MED ORDER — LEVOTHYROXINE SODIUM 100 MCG PO TABS: 100.0000 ug | ORAL_TABLET | Freq: Every day | ORAL | Status: DC

## 2015-06-30 ENCOUNTER — Telehealth: Payer: Self-pay | Admitting: Family

## 2015-06-30 DIAGNOSIS — R609 Edema, unspecified: Secondary | ICD-10-CM

## 2015-06-30 MED ORDER — METOPROLOL TARTRATE 50 MG PO TABS: ORAL_TABLET | ORAL | Status: DC

## 2015-06-30 MED ORDER — LEVOTHYROXINE SODIUM 100 MCG PO TABS: 100.0000 ug | ORAL_TABLET | Freq: Every day | ORAL | Status: DC

## 2015-06-30 MED ORDER — FUROSEMIDE 20 MG PO TABS
20.0000 mg | ORAL_TABLET | Freq: Every day | ORAL | Status: DC
Start: 2015-06-30 — End: 2016-01-20

## 2015-06-30 MED ORDER — RANITIDINE HCL 150 MG PO TABS: 150.0000 mg | ORAL_TABLET | Freq: Two times a day (BID) | ORAL | Status: AC

## 2015-06-30 NOTE — Telephone Encounter (Signed)
Med refills sent

## 2015-06-30 NOTE — Telephone Encounter (Signed)
Thanks.  Message routed to PCP.   Pt last seen on 01/05/15.
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
## 2015-07-01 NOTE — Telephone Encounter (Signed)
Please notify family that meds sent.

## 2015-07-01 NOTE — Telephone Encounter (Signed)
Left message on below voicemail that refills were sent and to call if any further concerns.

## 2015-07-08 ENCOUNTER — Ambulatory Visit: Payer: Self-pay | Admitting: Family

## 2015-07-08 ENCOUNTER — Telehealth: Payer: Self-pay | Admitting: Family

## 2015-07-27 NOTE — Telephone Encounter (Signed)
Yes please

## 2015-07-27 NOTE — Telephone Encounter (Signed)
Pt was no show 07/08/15 9:30am, follow up appt, pt has not rescheduled, 4th no show LBSW this year, charge for no show?

## 2015-07-28 ENCOUNTER — Encounter: Payer: Self-pay | Admitting: Family

## 2015-07-28 ENCOUNTER — Telehealth: Payer: Self-pay | Admitting: Family

## 2015-07-28 NOTE — Telephone Encounter (Signed)
Opened in error

## 2015-08-08 ENCOUNTER — Telehealth: Payer: Self-pay | Admitting: Family

## 2015-08-08 NOTE — Telephone Encounter (Signed)
Patient dismissed from [facility] by Sandford Craze , effective July 28, 2015. Dismissal letter sent out by certified / registered mail.  DAJ  Received signed domestic return receipt verifying delivery of certified letter on August 13, 2015. Article number [id number]

## 2015-09-01 IMAGING — US US EXTREM LOW VENOUS BILAT
1 series · 13 of 24 positions shown · non-contrast
Comparison: None.

CLINICAL DATA: Bilateral lower extremity swelling for 3-4 days.
Recent thyroidectomy.



[Series 1: us extrem low venous bilat · 0.08mm/px · 13 of 54 slices shown]
[im 1/54]
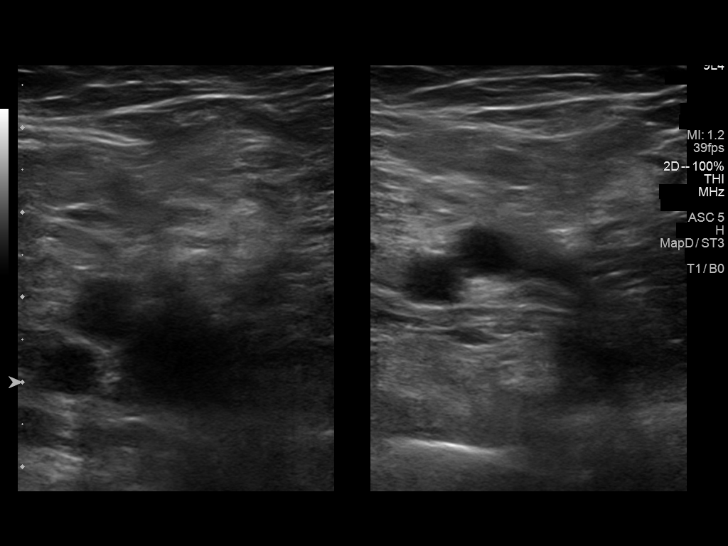
[im 5/54]
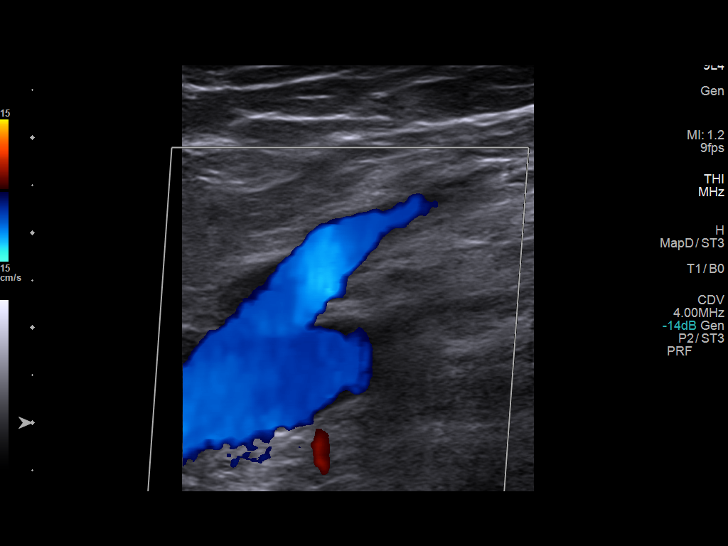
[im 10/54]
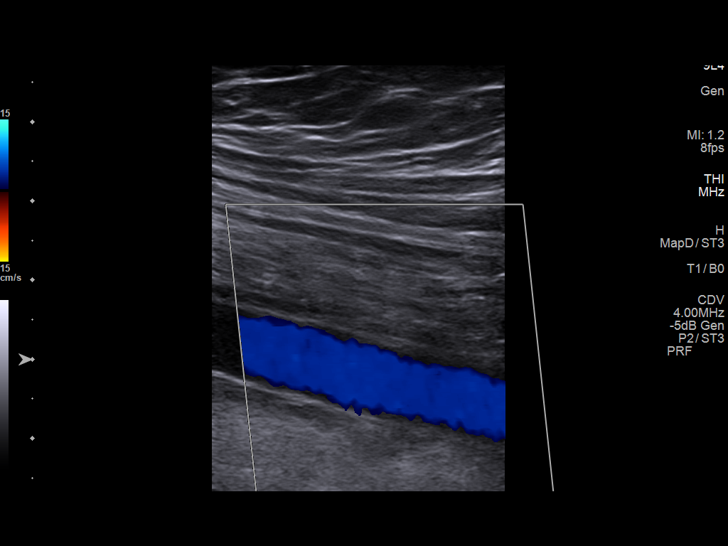
[im 14/54]
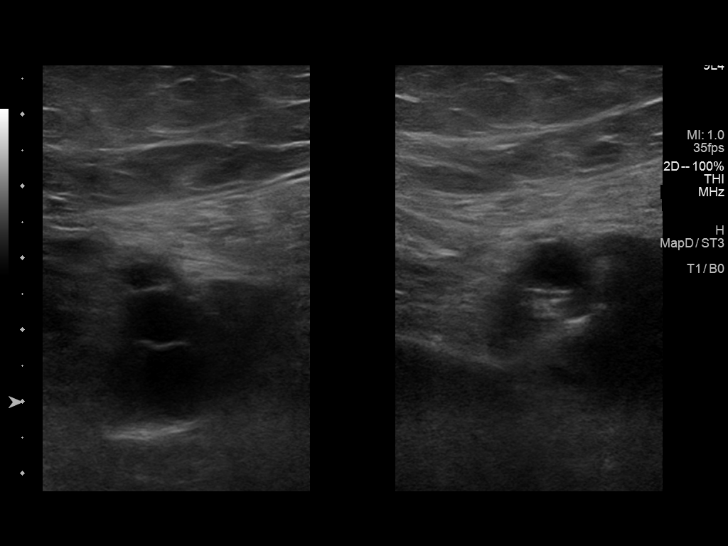
[im 19/54]
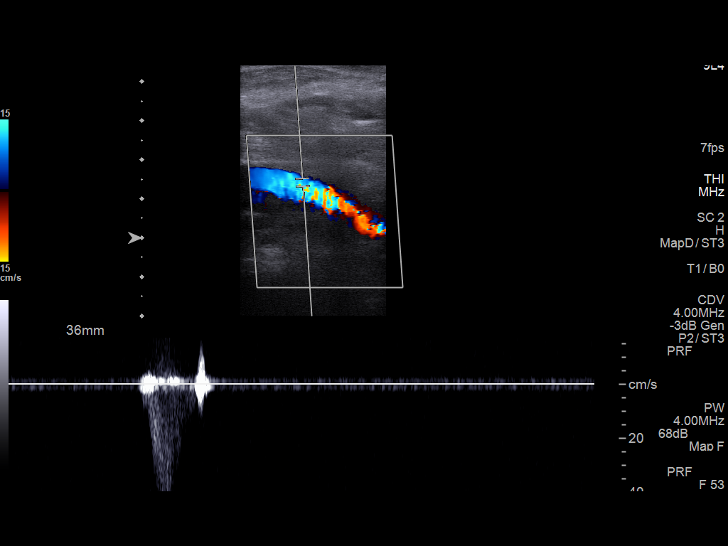
[im 24/54]
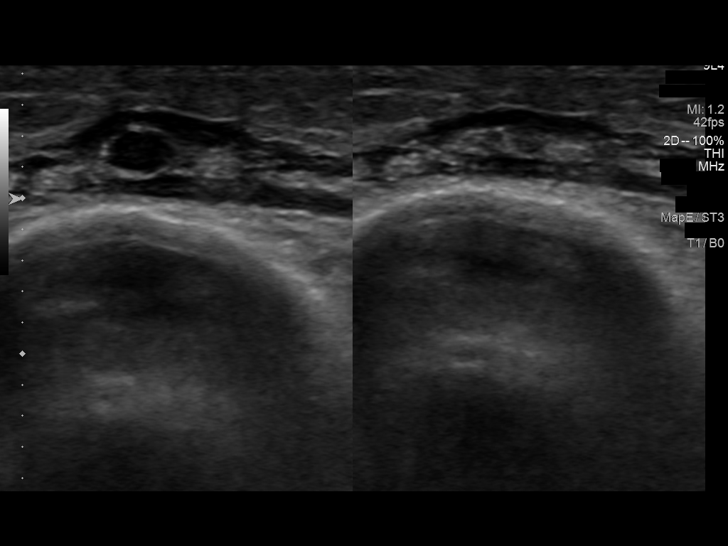
[im 28/54]
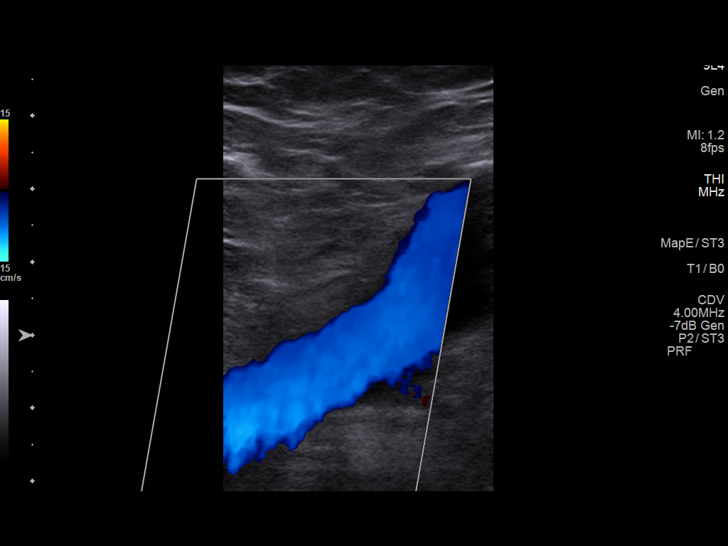
[im 30/54]
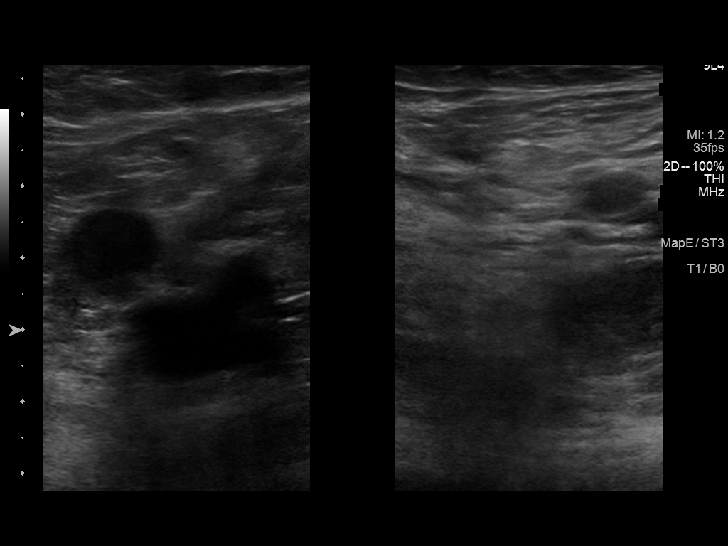
[im 35/54]
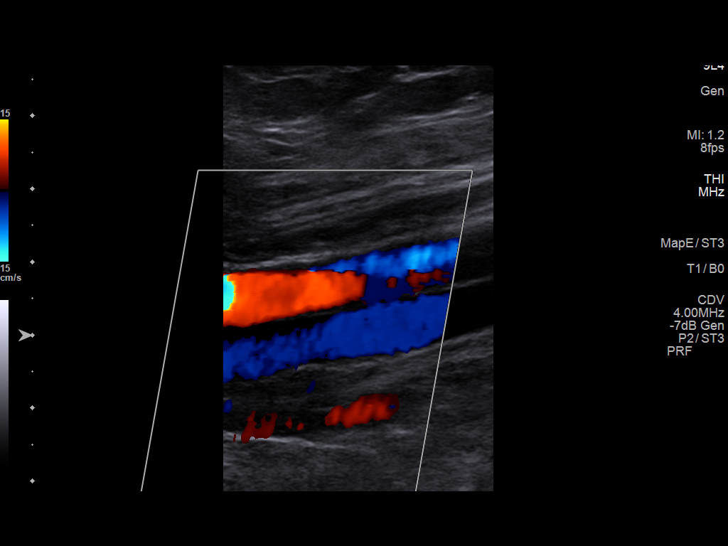
[im 40/54]
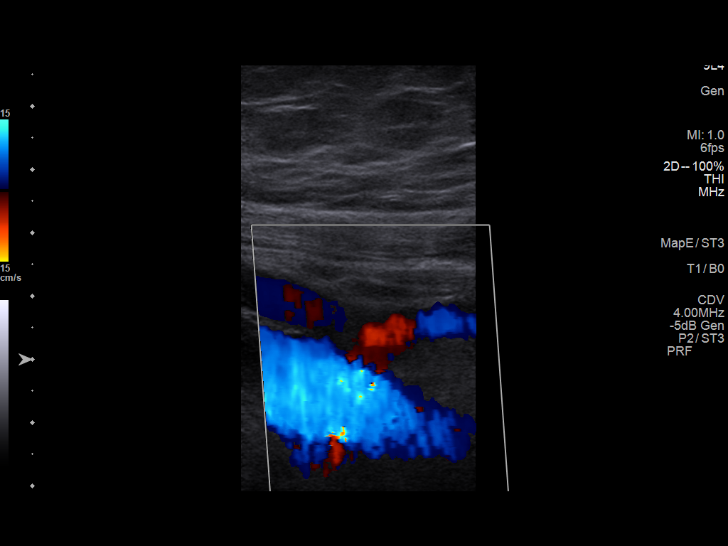
[im 44/54]
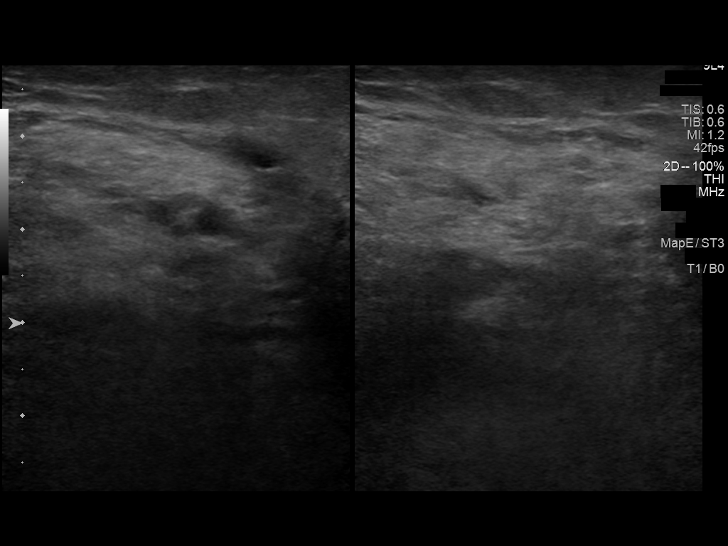
[im 49/54]
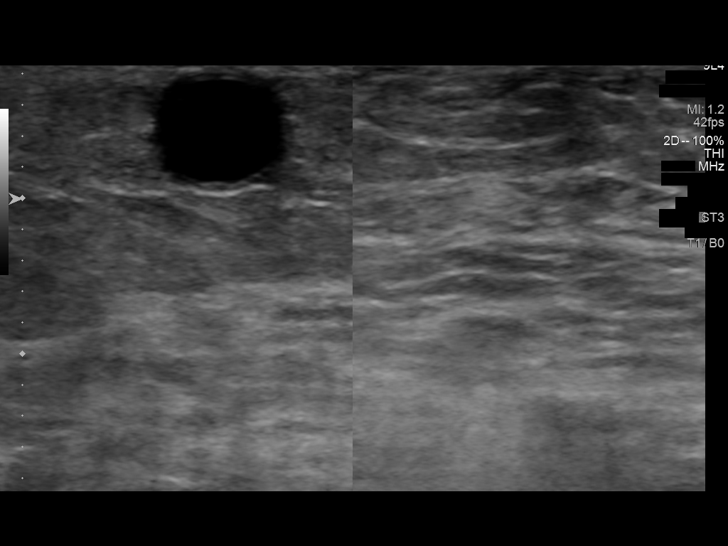
[im 54/54]
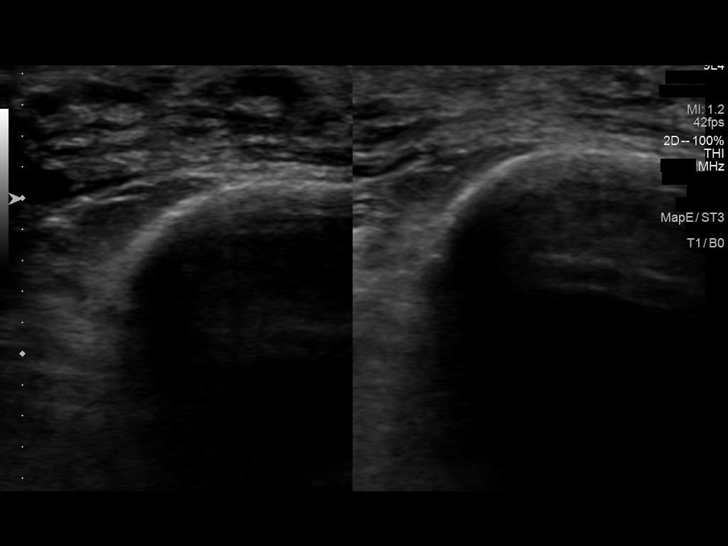

[13 of 24 positions shown; findings below may reference images not displayed]

FINDINGS: RIGHT LOWER EXTREMITY

Normal compressibility, augmentation and color Doppler flow in the
right common femoral vein, right femoral vein and right popliteal
vein. The proximal right profunda femoral vein is patent. The right
saphenofemoral junction is patent. The visualized right deep calf
veins are patent. Right great saphenous vein is patent.

LEFT LOWER EXTREMITY

Normal compressibility, augmentation and color Doppler flow in the
left common femoral vein, left femoral vein and left popliteal vein.
Left saphenofemoral junction is patent. Left proximal profunda
femoral vein is patent. Visualized deep left calf veins are patent.
Left great saphenous vein is patent. The left great saphenous vein
at the knee is enlarged measuring up to 8 mm. There are left calf
varicosities which are compressible.
IMPRESSION: Negative for deep vein thrombosis.

Enlarged left great saphenous vein with left calf varicosities.
Findings are consistent with superficial venous insufficiency in the
left lower extremity.

## 2015-09-01 IMAGING — CR DG CHEST 2V
2 series · 2 of 2 positions shown · non-contrast
Comparison: PA and lateral chest x-ray November 26, 2014 and
November 12, 2014.

CLINICAL DATA: Persistent cough since thyroid surgery 2 weeks ago.

EXAM:
CHEST  2 VIEW

[w chest pa]
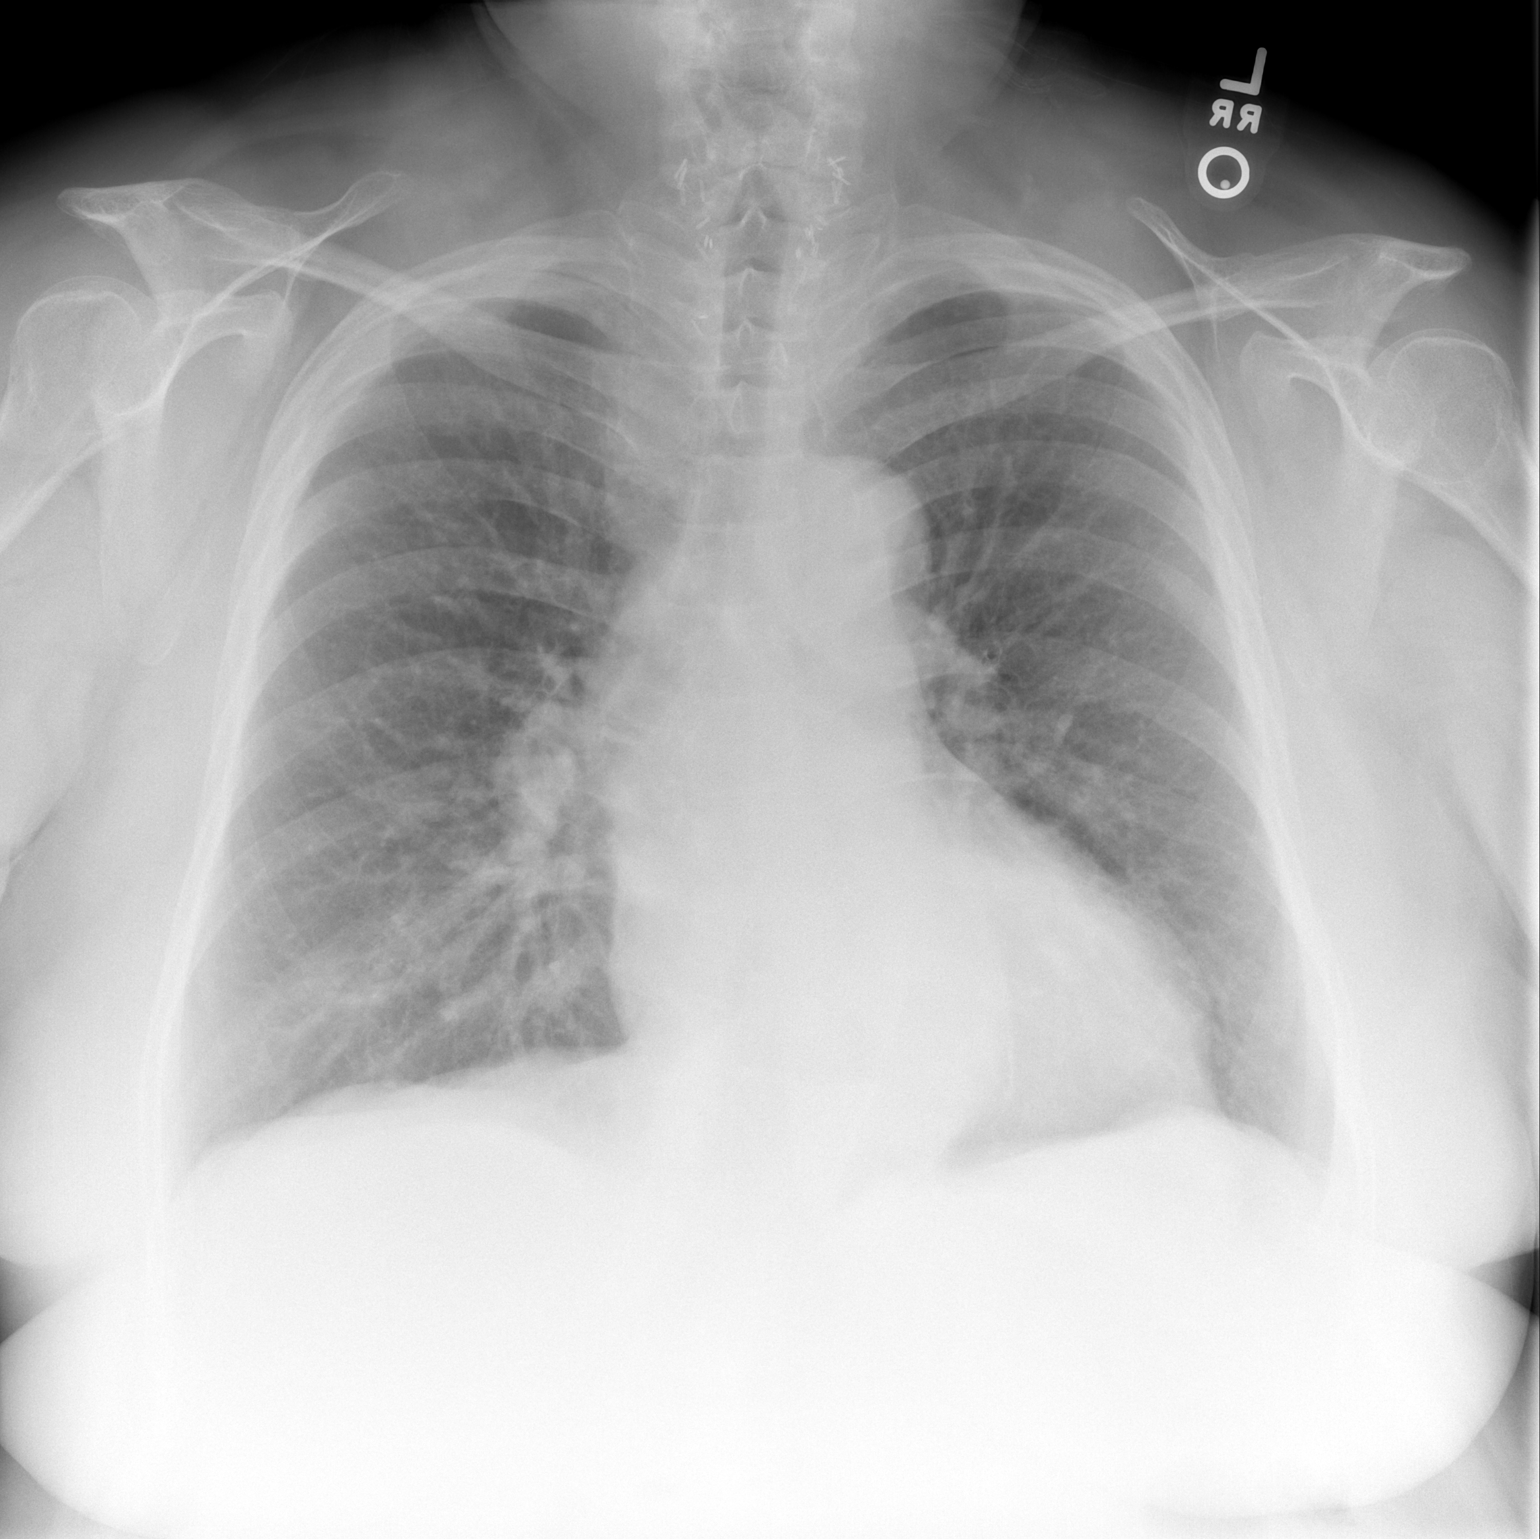

[w chest lat]
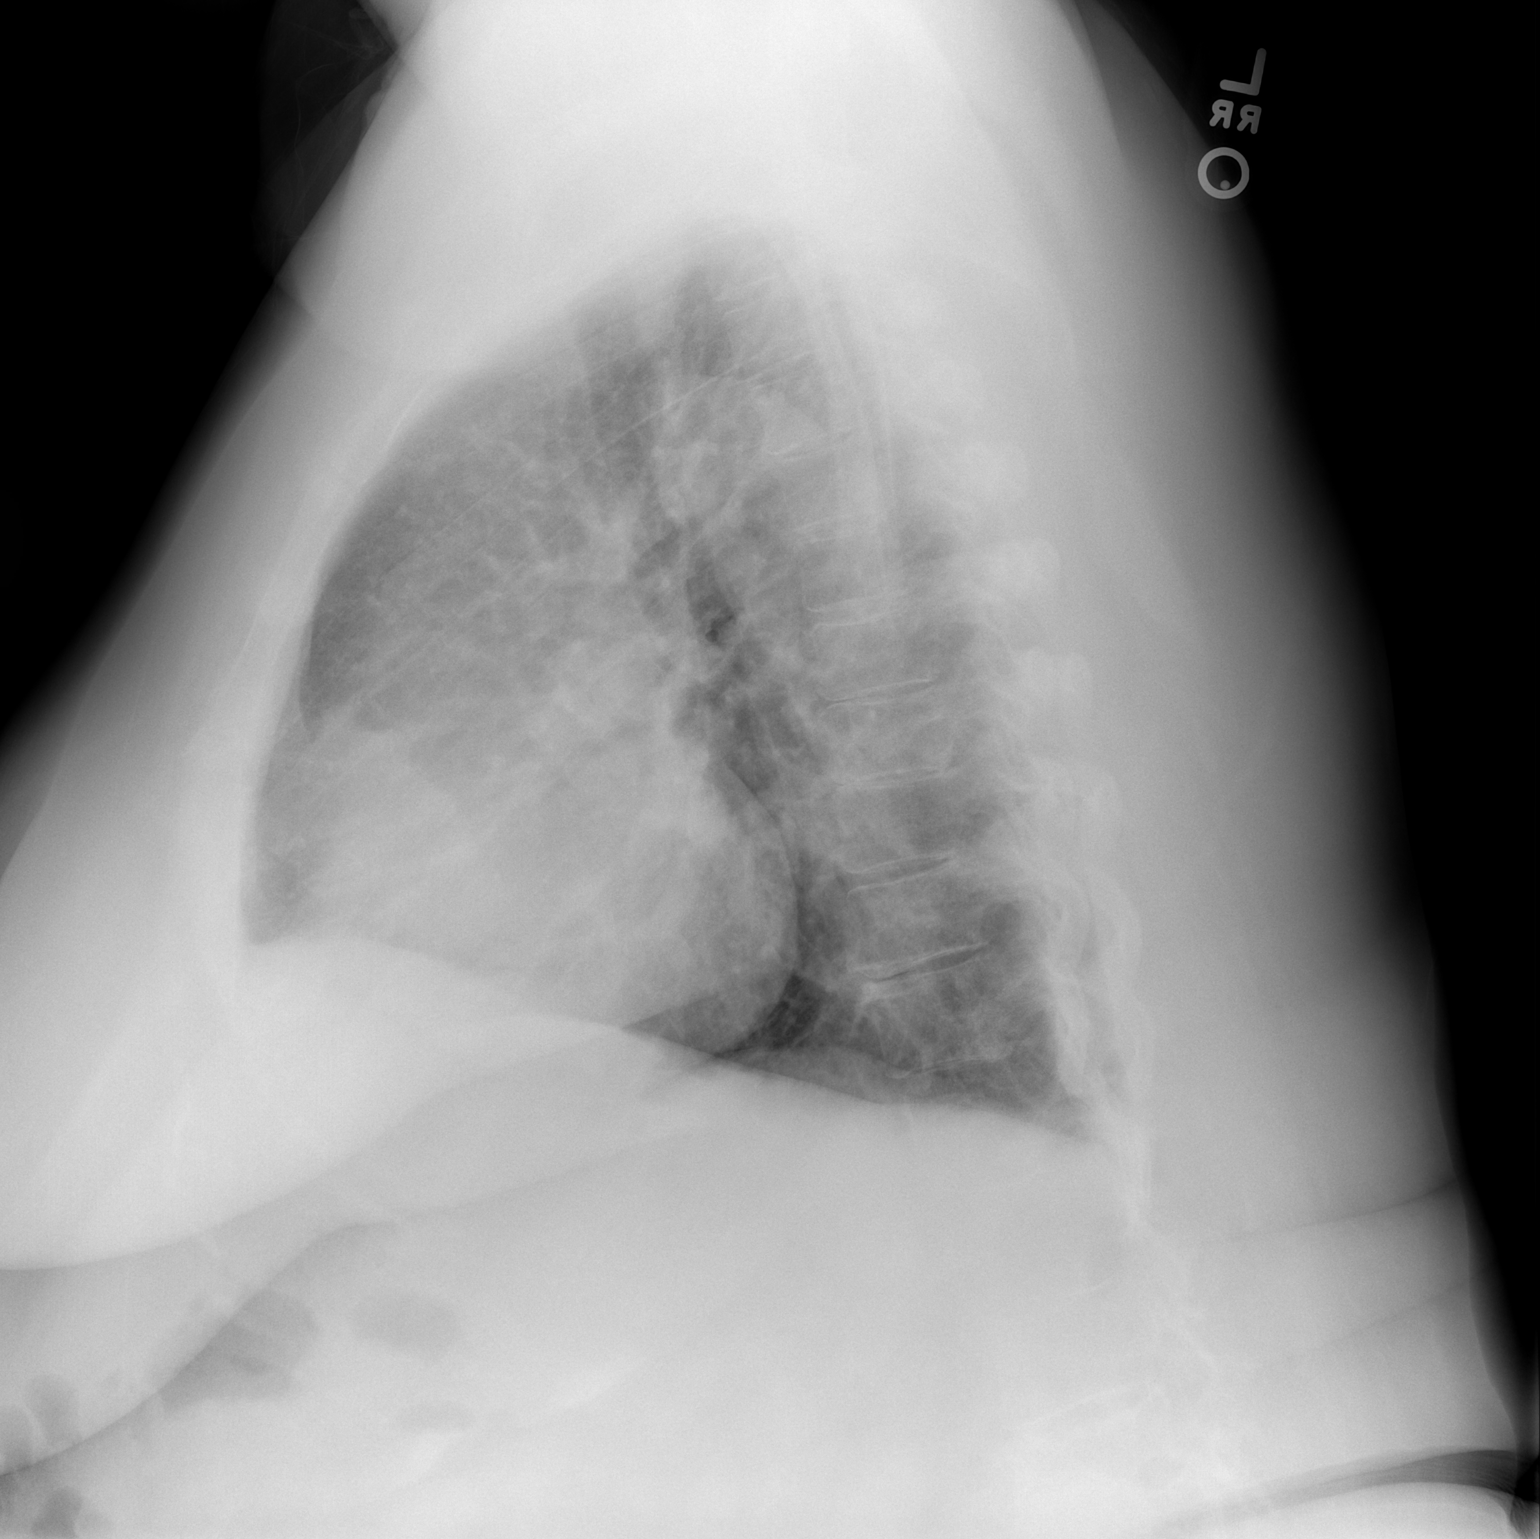

[2 of 2 positions shown; findings below may reference images not displayed]

FINDINGS: The lungs are mildly hyperinflated. The interstitial markings are
coarse and slightly more conspicuous than on the previous studies.
The cardiac silhouette is normal in size. The pulmonary vascularity
is not engorged. There is mild tortuosity of the descending thoracic
aorta. There is no pleural effusion or pneumothorax. The trachea is
midline. There surgical clips which project in the paratracheal
regions bilaterally in the lower neck.
IMPRESSION: Mild hyperinflation with hemidiaphragm flattening may reflect
underlying COPD. Slight increased prominence of the pulmonary
interstitium is noted and may reflect low-grade pulmonary vascular
congestion.

## 2015-11-29 ENCOUNTER — Emergency Department (HOSPITAL_COMMUNITY)
Admission: EM | Admit: 2015-11-29 | Discharge: 2015-11-29 | Disposition: A | Discharge status: Home / Self Care | Payer: Self-pay | Attending: Physician Assistant | Admitting: Physician Assistant

## 2015-11-29 ENCOUNTER — Encounter (HOSPITAL_COMMUNITY): Payer: Self-pay

## 2015-11-29 ENCOUNTER — Emergency Department (EMERGENCY_DEPARTMENT_HOSPITAL): Payer: Self-pay

## 2015-11-29 DIAGNOSIS — L03115 Cellulitis of right lower limb: Secondary | ICD-10-CM | POA: Insufficient documentation

## 2015-11-29 DIAGNOSIS — I10 Essential (primary) hypertension: Secondary | ICD-10-CM | POA: Insufficient documentation

## 2015-11-29 DIAGNOSIS — E669 Obesity, unspecified: Secondary | ICD-10-CM | POA: Insufficient documentation

## 2015-11-29 DIAGNOSIS — E039 Hypothyroidism, unspecified: Secondary | ICD-10-CM | POA: Insufficient documentation

## 2015-11-29 DIAGNOSIS — Z79899 Other long term (current) drug therapy: Secondary | ICD-10-CM | POA: Insufficient documentation

## 2015-11-29 DIAGNOSIS — M7989 Other specified soft tissue disorders: Secondary | ICD-10-CM

## 2015-11-29 DIAGNOSIS — M79609 Pain in unspecified limb: Secondary | ICD-10-CM

## 2015-11-29 DIAGNOSIS — Z8739 Personal history of other diseases of the musculoskeletal system and connective tissue: Secondary | ICD-10-CM | POA: Insufficient documentation

## 2015-11-29 DIAGNOSIS — K219 Gastro-esophageal reflux disease without esophagitis: Secondary | ICD-10-CM | POA: Insufficient documentation

## 2015-11-29 LAB — I-STAT CHEM 8, ED
BUN: 7 mg/dL (ref 6–20)
CHLORIDE: 100 mmol/L — AB (ref 101–111)
CREATININE: 0.6 mg/dL (ref 0.44–1.00)
Calcium, Ion: 1.01 mmol/L — ABNORMAL LOW (ref 1.13–1.30)
GLUCOSE: 99 mg/dL (ref 65–99)
HCT: 38 % (ref 36.0–46.0)
Hemoglobin: 12.9 g/dL (ref 12.0–15.0)
POTASSIUM: 3.3 mmol/L — AB (ref 3.5–5.1)
Sodium: 140 mmol/L (ref 135–145)
TCO2: 28 mmol/L (ref 0–100)

## 2015-11-29 MED ORDER — HYDROCODONE-ACETAMINOPHEN 5-325 MG PO TABS
1.0000 | ORAL_TABLET | Freq: Once | ORAL | Status: AC
  Administered 2015-11-29: 1 via ORAL
  Filled 2015-11-29: qty 1

## 2015-11-29 MED ORDER — ONDANSETRON 4 MG PO TBDP
4.0000 mg | ORAL_TABLET | Freq: Once | ORAL | Status: AC
  Administered 2015-11-29: 4 mg via ORAL
  Filled 2015-11-29: qty 1

## 2015-11-29 MED ORDER — SULFAMETHOXAZOLE-TRIMETHOPRIM 800-160 MG PO TABS
1.0000 | ORAL_TABLET | Freq: Two times a day (BID) | ORAL | Status: AC
Start: 2015-11-29 — End: 2015-12-06

## 2015-11-29 MED ORDER — OXYCODONE-ACETAMINOPHEN 5-325 MG PO TABS
1.0000 | ORAL_TABLET | Freq: Once | ORAL | Status: AC
  Administered 2015-11-29: 1 via ORAL
  Filled 2015-11-29: qty 1

## 2015-11-29 MED ORDER — OXYCODONE-ACETAMINOPHEN 5-325 MG PO TABS: 1.0000 | ORAL_TABLET | Freq: Four times a day (QID) | ORAL | Status: AC | PRN

## 2015-11-29 MED ORDER — SULFAMETHOXAZOLE-TRIMETHOPRIM 800-160 MG PO TABS
1.0000 | ORAL_TABLET | Freq: Once | ORAL | Status: AC
  Administered 2015-11-29: 1 via ORAL
  Filled 2015-11-29: qty 1

## 2015-11-29 NOTE — Discharge Instructions (Signed)
Celulitis (Cellulitis) La celulitis es una infeccin de la piel y del tejido subyacente. El rea infectada generalmente est de color rojo y duele. Ocurre con ms frecuencia en los brazos y en las piernas.  CAUSAS  La causa es una bacteria que ingresa en la piel a travs de grietas o cortes. Los tipos ms frecuentes de bacterias que causan celulitis son los estafilococos y los estreptococos. SIGNOS Y SNTOMAS   Enrojecimiento y calor.  Hinchazn.  Sensibilidad o dolor.  Grant Ruts. DIAGNSTICO  Por lo general, el mdico puede diagnosticar esta afeccin con un examen fsico. Es posible que le indiquen anlisis de [place]. TRATAMIENTO  El tratamiento consiste en tomar antibiticos. INSTRUCCIONES PARA EL CUIDADO EN EL HOGAR   Tome los antibiticos como le indic el mdico. Termine los antibiticos aunque comience a sentirse mejor.  Mantenga el brazo o la pierna elevados para reducir la hinchazn.  Aplique paos calientes en la zona hasta 4 veces al da para Primary school teacher.  Tome los medicamentos solamente como se lo haya indicado el mdico.  Concurra a todas las visitas de control como se lo haya indicado el mdico. SOLICITE ATENCIN MDICA SI:   Brett Fairy lnea roja en la piel que sale desde la zona infectada.  El rea roja se extiende o se vuelve de color oscuro.  El hueso o la articulacin que se encuentran por debajo de la zona infectada le duelen despus de que la piel se [place].  La infeccin se repite en la misma zona o en una zona diferente.  Tiene un bulto inflamado en la zona infectada.  Presenta nuevos sntomas.  Tiene fiebre. SOLICITE ATENCIN MDICA DE INMEDIATO SI:   Se siente muy somnoliento.  Tiene vmitos o diarrea.  Se siente enfermo (malestar general) con dolores musculares.   Esta informacin no tiene Theme park manager el consejo del mdico. Asegrese de hacerle al mdico cualquier pregunta que tenga.   Document Released: 07/25/2005 Document  Revised: 07/06/2015 Elsevier Interactive Patient Education [employer]    Emergency Department Resource Guide 1) Find a Doctor and Pay Out of Pocket Although you won't have to find out who is covered by your insurance plan, it is a good idea to ask around and get recommendations. You will then need to call the office and see if the doctor you have chosen will accept you as a new patient and what types of options they offer for patients who are self-pay. Some doctors offer discounts or will set up payment plans for their patients who do not have insurance, but you will need to ask so you aren't surprised when you get to your appointment.  2) Contact Your Local Health Department Not all health departments have doctors that can see patients for sick visits, but many do, so it is worth a call to see if yours does. If you don't know where your local health department is, you can check in your phone book. The CDC also has a tool to help you locate your state's health department, and many state websites also have listings of all of their local health departments.  3) Find a Walk-in Clinic If your illness is not likely to be very severe or complicated, you may want to try a walk in clinic. These are popping up all over the country in pharmacies, drugstores, and shopping centers. They're usually staffed by nurse practitioners or physician assistants that have been trained to treat common illnesses and complaints. They're usually fairly quick and inexpensive.  However, if you have serious medical issues or chronic medical problems, these are probably not your best option.  No Primary Care Doctor: - Call Health Connect at  [phone] - they can help you locate a primary care doctor that  accepts your insurance, provides certain services, etc. - Physician Referral Service- [phone]  Chronic Pain Problems: Organization         Address  Phone   Notes  [facility]  [phone]  Patients need to be referred by their primary care doctor.   Medication Assistance: Organization         Address  Phone   Notes  [facility] Medication [facility] [address] [place] [place], [place] [address] [phone] --Must be a resident of [facility] -- Must have NO insurance coverage whatsoever (no Medicaid/ Medicare, etc.) -- The pt. MUST have a primary care doctor that directs their care regularly and follows them in the community   [facility]  [phone]   [employer]  [phone]    Agencies that provide inexpensive medical care: Organization         Address  Phone   Notes  [facility]  [phone]   [facility]    [phone]   [facility] [address] [place], [place] [address] [phone]   [facility] [address] [place] [place] [phone]   Planned Parenthood    [phone]   [facility]    [phone]   [facility]  [address], [place] Phone:  [phone], Fax:  [phone] Hours of Operation:  9 am - 6 pm, M-F.  Also accepts Medicaid/Medicare and self-pay.  [facility]  [address], [place] Phone: [phone], Fax: [phone]. Hours of Operation:  8:30 am - 5:30 pm, M-F.  Also accepts Medicaid and self-pay.  [facility] [place] [address] [place] Phone: [phone]   [facility] [address] Natasha Bence [place], [place] [phone], Ext. [phone] Mondays & Thursdays: 7-9 AM.  First 15 patients are seen on a first come, first serve basis.    Medicaid-accepting [facility] Providers:  Organization         Address  Phone   Notes  [facility] [address], [place] [phone] Also accepts self-pay patients.  [facility] [address] Laurell Josephs [place], [place]  [phone]   [facility] [address], [place] [phone]   [facility] [address] [place] [phone]   Renaye Rakers [address], [place]   [phone] Only accepts [place] Access [place] patients after they have their name applied to their card.   Self-Pay (no insurance) in [facility]:  Organization         Address  Phone   Notes  Sickle Cell Patients, [facility] [address] [place], [place] [phone]   [facility] [address] [place], [place] [phone]   [facility] [place]  [address], [place] [phone]   [facility]/Dr. Osei-Bonsu  [address], [place] or [address], [place] [phone] Phone number for both [place] and [place] locations is the same.  [facility] [address] [place] [phone]   [facility] [address] [place] or [address] [phone]   [facility] [address], [place] [phone], phone; [phone], fax Sees patients 1st and 3rd Saturday of every month.  Must not qualify for public or private insurance (i.e. Medicaid, Medicare, [place] Health Choice, Veterans' Benefits)  Household income should be no more than 200% of the poverty level The clinic cannot treat you if you are pregnant or think you are pregnant  Sexually transmitted diseases are not treated at the clinic.    Dental Care: Organization         Address  Phone  Notes  [facility] Department of [facility] [address] [place], [place] [phone] Accepts children up to age 1 who are enrolled in [place] or [place] Health Choice; pregnant women with a Medicaid card; and children who have applied for Medicaid or [place] Health Choice, but were  declined, whose parents can pay a reduced fee at time of service.  [facility]  [address], [place] [phone] Accepts children up to age 77 who are enrolled in [place] or [place] Health Choice; pregnant women with a Medicaid card; and children who have applied for Medicaid or [place] Health Choice, but were declined, whose parents can pay a reduced fee at time of service.  [facility]  [address] [place], [place] [phone] Patients are seen by appointment only. Walk-ins are not accepted. [facility] will see patients 46 years of age and older. Monday - Tuesday (8am-5pm) Most Wednesdays (8:30-5pm) $30 per visit, cash only  [facility] Adult Dental Access PRO[facility]  [address], [facility] [phone] Patients are seen by appointment only. Walk-ins are not accepted. [facility] will see patients 78 years of age and older. One Wednesday Evening (Monthly: Volunteer Based).  $30 per visit, cash only  [employer]  [phone] for adults; Children under age 34, call Graduate Pediatric Dentistry at [phone]. Children aged 45-14, please call [phone] to request a pediatric application.  Dental services are provided in all areas of dental care including fillings, crowns and bridges, complete and partial dentures, implants, gum treatment, root canals, and extractions. Preventive care is also provided. Treatment is provided to both adults and children. Patients are selected via a lottery and there is often a waiting list.   [facility] [address] [place]  [phone] [email or web address]   [facility] [address] [place], [place] [phone], Ext. [phone] Second and Fourth Thursday of each month, opens at 6:30 AM; Clinic ends at 9 AM.  Patients are seen on a first-come first-served basis, and a limited number are seen during each clinic.    [facility]  [address] Ether Griffins [place], [place] [phone]   Eligibility Requirements You must have lived in [place], [place], or [place] counties for at least the last three months.   You cannot be eligible for state or federal sponsored [place], including [employer], [place], or [employer].   You generally cannot be eligible for healthcare insurance through your employer.    How to apply: Eligibility screenings are held every Tuesday and Wednesday afternoon from 1:00 pm until 4:00 pm. You  do not need an appointment for the interview!  [facility] [address] [place], [place] [phone]   [facility] Health Department  [phone]   [facility] Health Department  [phone]   [facility] Health Department  [phone]    Behavioral Health Resources in the Community: Intensive Outpatient Programs Organization         Address  Phone  Notes  [facility] [address] [place], [place] [phone]   [facility] Outpatient [address] [place], [place] [phone]   [facility] [address], [place], [place]  [phone]   [facility] Mental Health [address]  [place], [place] [phone] or [phone]   Substance Abuse Resources Organization         Address  Phone  Notes  [facility]  [phone]   [facility]  [phone]   [facility]  [phone]   Floydene Flock  [phone]   Residential & Outpatient Substance Abuse Program  [phone]   Psychological Services Organization         Address  Phone  Notes  [facility]  [phone]   [facility] Services  [phone]   [facility] [address], [place] [phone] or [phone]    Mobile Crisis Teams Organization         Address  Phone  Notes  [facility], Mobile Crisis Care  Unit  [phone]   [facility]  [address] [place], [place] [phone]   Doristine Locks [address] [place] [phone]    Self-Help/Support Groups Organization         Address  Phone             Notes  [facility] [place] - variety of support groups  336- [phone] Call for more information  Narcotics Anonymous (NA), [facility] [address], [employer] [place]  2 meetings at this location   Statistician         Address  Phone  Notes  [facility] [address],    [place]  [phone]   [facility]  [address], [place] [address], [place], [place] [phone]   [facility] [address] [place], [place] [phone] Admissions: 8am-3pm M-F  [facility] [address]    [place], [place] [phone]   [facility] [address] [place], [place], [place] [phone]   [facility] [address]  [place], [place] [phone]   [facility] - Intensive Outpatient [address] Laurell Joseph[address], [place], [place] [phone]   [facility] ([facility] [address] [place]  [place], [place] [phone] or [phone]   [facility] ([facility]) [address], [place], [place] [phone] Accepts Medicaid  [facility] [place] [address],  [place] [phone] Substance Abuse/Addiction Treatment   [facility] Organization         Address  Phone  Notes  [facility]  [phone]   Angie Fava, PhD [address] Ervin Knack [place], [place]   [phone] or [phone]   [facility]   [address] [place], [place] [phone]   [facility] [address], [place], [place] [phone] Insurance/Medicaid/sponsorship through [employer] and Families [address]  [place], [place] [phone] [place], [place] [phone]    Dr. Adele Schilder  [phone]   [facility] Dept. 1) [address] [place] 2) [place] 3)  [address], [place] [phone]  [phone]   [place] [phone] or [phone] (After Hours)

## 2015-11-29 NOTE — ED Notes (Signed)
Bed: NW29 Expected date:  Expected time:  Means of arrival:  Comments: RM 27

## 2015-11-29 NOTE — ED Notes (Signed)
Patient c/o  Mid back, bil arm, hand, knee and ankle pain x 2 weeks. Patient states she has been taking Tylenol with no relief.

## 2015-11-29 NOTE — ED Provider Notes (Signed)
CSN: [id number]     Arrival date & time 11/29/15  0940 History   First MD Initiated Contact with Patient 11/29/15 1021     Chief Complaint  Patient presents with  . Joint Pain  . Back Pain     (Consider location/radiation/quality/duration/timing/severity/associated sxs/prior Treatment) HPI   Shannon [name] This 65 year old female who presents emergency Department with chief complaint of right lower leg swelling. 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 She also has a history of varicose veins, obesity, arthritis. She complains of heaviness, swelling and pain in her right calf for the past week. She denies chest pain or shortness of breath. She has associated flulike symptoms of myalgias and subjective fever. She denies a history of DVT or pulmonary embolus. She denies weakness.  Denies DOE, SOB, chest tightness or pressure, radiation to left arm, jaw or back, or diaphoresis. Denies dysuria, flank pain, suprapubic pain, frequency, urgency, or hematuria. Denies headaches, light headedness, weakness, visual disturbances. Denies abdominal pain, nausea, vomiting, diarrhea or constipation.    Past Medical History  Diagnosis Date  . Hypothyroidism   . Hypertension   . Varicose vein of leg   . Osteoarthritis   . GERD (gastroesophageal reflux disease)     occasional prilosec   Past Surgical History  Procedure Laterality Date  . Hip surgery  2008-9    right  . Hip surgery   2014    left   . Joint replacement      bil total hips  . Thyroidectomy N/A 11/18/2014    Procedure: TOTAL THYROIDECTOMY;  Surgeon: Darnell Level, MD;  Location: [facility];  Service: General;  Laterality: N/A;   Family History  Problem Relation Age of Onset  . High blood pressure     Social History  Substance Use Topics  . Smoking status: Never Smoker   . Smokeless tobacco: Never Used  . Alcohol Use: Yes   OB History    No data  available     Review of Systems   Ten systems reviewed and are negative for acute change, except as noted in the HPI.   Allergies  Review of patient's allergies indicates no known allergies.  Home Medications   Prior to Admission medications   Medication Sig Start Date End Date Taking? Authorizing Provider  calcium-vitamin D (OSCAL WITH D) 500-200 MG-UNIT per tablet Take 2 tablets by mouth 3 (three) times daily. 11/19/14   Darnell Level, MD  furosemide (LASIX) 20 MG tablet Take 1 tablet (20 mg total) by mouth daily. 06/30/15   Sandford Craze, NP  levothyroxine (SYNTHROID, LEVOTHROID) 100 MCG tablet Take 1 tablet (100 mcg total) by mouth daily. 06/30/15   Sandford Craze, NP  metoprolol (LOPRESSOR) 50 MG tablet Take 1/4 tablet by mouth once daily. 06/30/15   Sandford Craze, NP  omeprazole (PRILOSEC) 40 MG capsule Take 40 mg by mouth daily as needed.     Historical Provider, MD  ranitidine (ZANTAC) 150 MG tablet Take 1 tablet (150 mg total) by mouth 2 (two) times daily. 06/30/15   Sandford Craze, NP   BP 129/77 mmHg  Pulse 81  Temp(Src) 98 F (36.7 C) (Oral)  Resp 17  SpO2 96% Physical Exam  Constitutional: She is oriented to person, place, and time. She appears well-developed and well-nourished. No distress.  HENT:  Head: Normocephalic and atraumatic.  Eyes: Conjunctivae are normal. No scleral icterus.  Neck: Normal range of motion.  Cardiovascular: Normal rate, regular rhythm and  normal heart sounds.  Exam reveals no gallop and no friction rub.   No murmur heard. Pulmonary/Chest: Effort normal and breath sounds normal. No respiratory distress.  Abdominal: Soft. Bowel sounds are normal. She exhibits no distension and no mass. There is no tenderness. There is no guarding.  Musculoskeletal:  Patient with unilateral swelling of the right lower extremity, tenderness to palpation, especially with palpation of the calf, mild erythema, without streaking. No weeping. Distal pulses  and sensation are intact. Multiple varicosities are present. Patient has a compression sleeve removed from the left with very prominent varicosities that are nontender, no calf swelling present at this time, although a small amount of swelling is present just above the top edge of the compression sleeve for sitting.  Neurological: She is alert and oriented to person, place, and time.  Skin: Skin is warm and dry. She is not diaphoretic.  Nursing note and vitals reviewed.   ED Course  Procedures (including critical care time) Labs Review Labs Reviewed  I-STAT CHEM 8, ED    Imaging Review No results found. I have personally reviewed and evaluated these images and lab results as part of my medical decision-making.   EKG Interpretation None      MDM   Final diagnoses:  None    Patient here with complaint of unilateral right lower extremity swelling. She has been less mobile because of body aches. A question whether the patient has a DVT versus developing cellulitis with systemic symptoms of myalgia. Patient will be given a lower extremity ultrasound to rule out DVT. We will get a basic i-STAT chemistry panel 20, looked at her calcium levels. I have given over care to Dr. Benedict Needy who will follow the patient.   Arthor Captain, PA-C 11/29/15 1152  Courteney Randall An, MD 11/29/15 1303  Courteney Randall An, MD 11/29/15 1303

## 2015-11-29 NOTE — Progress Notes (Signed)
VASCULAR LAB PRELIMINARY  PRELIMINARY  PRELIMINARY  PRELIMINARY  Right lower extremity venous duplex completed.    Preliminary report:  Right:  No evidence of DVT, superficial thrombosis, or Baker's cyst.  Mickayla Trouten, RVT 11/29/2015, 12:31 PM

## 2015-12-12 ENCOUNTER — Ambulatory Visit: Payer: Self-pay | Admitting: Family Medicine

## 2016-01-09 ENCOUNTER — Ambulatory Visit: Payer: Self-pay | Admitting: Family Medicine

## 2016-01-20 ENCOUNTER — Encounter: Payer: Self-pay | Admitting: Family Medicine

## 2016-01-20 ENCOUNTER — Ambulatory Visit (INDEPENDENT_AMBULATORY_CARE_PROVIDER_SITE_OTHER): Payer: Self-pay | Admitting: Family Medicine

## 2016-01-20 VITALS — BP 101/61 | HR 72 | Temp 98.3°F | Resp 18 | Ht 62.0 in | Wt 283.0 lb

## 2016-01-20 DIAGNOSIS — E039 Hypothyroidism, unspecified: Secondary | ICD-10-CM

## 2016-01-20 DIAGNOSIS — Z0000 Encounter for general adult medical examination without abnormal findings: Secondary | ICD-10-CM

## 2016-01-20 DIAGNOSIS — K219 Gastro-esophageal reflux disease without esophagitis: Secondary | ICD-10-CM

## 2016-01-20 LAB — COMPLETE METABOLIC PANEL WITH GFR
ALT: 7 U/L (ref 6–29)
AST: 8 U/L — AB (ref 10–35)
Albumin: 3.2 g/dL — ABNORMAL LOW (ref 3.6–5.1)
Alkaline Phosphatase: 57 U/L (ref 33–130)
BUN: 15 mg/dL (ref 7–25)
CO2: 30 mmol/L (ref 20–31)
Calcium: 7.6 mg/dL — ABNORMAL LOW (ref 8.6–10.4)
Chloride: 100 mmol/L (ref 98–110)
Creat: 0.81 mg/dL (ref 0.50–0.99)
GFR, EST NON AFRICAN AMERICAN: 76 mL/min (ref >=60)
GFR, Est African American: 88 mL/min (ref >=60)
GLUCOSE: 86 mg/dL (ref 65–99)
POTASSIUM: 4 mmol/L (ref 3.5–5.3)
SODIUM: 142 mmol/L (ref 135–146)
Total Bilirubin: 0.3 mg/dL (ref 0.2–1.2)
Total Protein: 6 g/dL — ABNORMAL LOW (ref 6.1–8.1)

## 2016-01-20 LAB — LIPID PANEL
CHOL/HDL RATIO: 4.2 ratio (ref <=5.0)
Cholesterol: 199 mg/dL (ref 125–200)
HDL: 47 mg/dL (ref >=46)
LDL CALC: 103 mg/dL (ref <130)
Triglycerides: 244 mg/dL — ABNORMAL HIGH (ref <150)
VLDL: 49 mg/dL — AB (ref <30)

## 2016-01-20 LAB — TSH: TSH: 20.5 m[IU]/L — AB

## 2016-01-20 MED ORDER — CALCIUM CARBONATE-VITAMIN D 500-200 MG-UNIT PO TABS: 2.0000 | ORAL_TABLET | Freq: Three times a day (TID) | ORAL | Status: AC

## 2016-01-20 MED ORDER — LEVOTHYROXINE SODIUM 100 MCG PO TABS: 100.0000 ug | ORAL_TABLET | Freq: Every day | ORAL | Status: DC

## 2016-01-20 MED ORDER — OMEPRAZOLE MAGNESIUM 20 MG PO TBEC: 20.0000 mg | DELAYED_RELEASE_TABLET | Freq: Every day | ORAL | Status: DC

## 2016-01-20 NOTE — Progress Notes (Signed)
Patient ID: Shannon [name], female   DOB: 11-Oct-1951, 65 y.o.   MRN: [id number]   Shannon [name], is a 65 y.o. female  GEX:[id number]  [id number]:[id number]  DOB - [birth year]  CC:  Chief Complaint  Patient presents with  . Follow-up    3 month       HPI: [name] is a 65 y.o. female here to establish care. She has been most recently been a patient at a [facility] but was discharged as a patient. I believe it was for no shows. She has a history of hypertension, GERD, Hypothyroidism, osteroarthritis, varicose veins and hyperglycemia She is on synthroid 100 mcg, omeprozole 20 mg and is oscal for hycalcemia. She has been on blood pressure medications but has not had in several days to a week and her blood pressure is under good control.  Her major complaint is multiple joint pain from osteoarthritis. She reports some shortness of breath with exertion. She is overweight and sedentary. She has difficulty walking for any distance and walks with a cane.  No Known Allergies Past Medical History  Diagnosis Date  . Hypothyroidism   . Hypertension   . Varicose vein of leg   . Osteoarthritis   . GERD (gastroesophageal reflux disease)     occasional prilosec   Current Outpatient Prescriptions on File Prior to Visit  Medication Sig Dispense Refill  . oxyCODONE-acetaminophen (PERCOCET/ROXICET) 5-325 MG tablet Take 1 tablet by mouth every 6 (six) hours as needed for severe pain. 11 tablet 0  . ranitidine (ZANTAC) 150 MG tablet Take 1 tablet (150 mg total) by mouth 2 (two) times daily. 60 tablet 0   No current facility-administered medications on file prior to visit.   Family History  Problem Relation Age of Onset  . High blood pressure     Social History   Social History  . Marital Status: Widowed    Spouse Name: N/A  . Number of Children: N/A  . Years of Education: N/A   Occupational History  . Not on file.   Social History Main Topics  . Smoking  status: Never Smoker   . Smokeless tobacco: Never Used  . Alcohol Use: Yes  . Drug Use: No  . Sexual Activity: No   Other Topics Concern  . Not on file   Social History Narrative   Ten children- 4 liven in [place], 6 in [place].  13 grandchildren, 5 great grand children   From [place]   She has lived here for 17 years   She has not worked recently due to hip problems.  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 Skin: Negative for rashes or lesions of concern. HENT: Negative for ear pain, ear discharge.nose bleeds Eyes: Negative for pain, discharge, redness, itching and visual disturbance. Neck: Negative for pain, stiffness Respiratory: Negative for cough, shortness of breath,   Cardiovascular: Negative for chest pain, palpitations and leg swelling. Gastrointestinal: Negative for abdominal pain, nausea, vomiting, diarrhea, constipations Genitourinary: Negative for dysuria, urgency, frequency, hematuria,  Musculoskeletal: Positive for back pain, lower extremetity pain, hip pain. Neurological: Negative for dizziness, tremors, seizures, syncope,   light-headedness, numbness and headaches.  Hematological: Negative for easy bruising or bleeding Psychiatric/Behavioral: Negative for depression, anxiety, decreased concentration, confusion   Objective:  Filed Vitals:   01/20/16 1124  BP: 101/61  Pulse: 72  Temp: 98.3 F (36.8 C)  Resp: 18    Physical Exam: Constitutional: Patient appears well-developed and well-nourished. No distress. HENT: Normocephalic, atraumatic, External right and left ear normal. Oropharynx is clear and moist.  Eyes: Conjunctivae and EOM are normal. PERRLA, no scleral icterus. Neck: Normal ROM. Neck supple. No lymphadenopathy, No thyromegaly. CVS: RRR, S1/S2 +, no  murmurs, no gallops, no rubs Pulmonary: Effort and breath sounds normal, no stridor, rhonchi, wheezes, rales.  Abdominal: Soft. Normoactive BS,, no distension, tenderness, rebound or guarding.  Musculoskeletal:  Decrease ROM of lower extremities. Gait is unsteady.  No edema and no tenderness.  Neuro: Alert. Non-focal Skin: Skin is warm and dry. No rash noted. Not diaphoretic. No erythema. No pallor. Psychiatric: Normal mood and affect. Behavior, judgment, thought content normal.  Lab Results  Component Value Date   WBC 6.1 11/26/2014   HGB 12.9 11/29/2015   HCT 38.0 11/29/2015   MCV 88.2 11/26/2014   PLT 310 11/26/2014   Lab Results  Component Value Date   CREATININE 0.60 11/29/2015   BUN 7 11/29/2015   NA 140 11/29/2015   K 3.3* 11/29/2015   CL 100* 11/29/2015   CO2 34* 01/05/2015    Lab Results  Component Value Date   HGBA1C 5.9* 06/09/2014   Lipid Panel  No results found for: CHOL, TRIG, HDL, CHOLHDL, VLDL, LDLCALC     Assessment and plan:   1. Hypothyroidism, unspecified hypothyroidism type  - COMPLETE METABOLIC PANEL WITH GFR - levothyroxine (SYNTHROID, LEVOTHROID) 100 MCG tablet; Take 1 tablet (100 mcg total) by mouth daily.  Dispense: 30 tablet; Refill: 3 - TSH  2. Health care maintenance  - COMPLETE METABOLIC PANEL WITH GFR - Lipid panel - MM DIGITAL SCREENING BILATERAL; Future  3. Gastroesophageal reflux disease, esophagitis presence not specified  - omeprazole (PRILOSEC OTC) 20 MG tablet; Take 1 tablet (20 mg total) by mouth daily.  Dispense: 30 tablet; Refill: 3  4. Hypocalcemia  - COMPLETE METABOLIC PANEL WITH GFR   Return in about 4 weeks (around 02/17/2016).  The patient was given clear instructions to go to ER or return to medical center if symptoms don't improve, worsen or new problems develop. The patient verbalized understanding.    Henrietta HooverLinda C Shauntea Lok FNP  01/20/2016, 3:09 PM

## 2016-01-20 NOTE — Patient Instructions (Signed)
Schedule to come back for PAP smear to check for cervical cancer in 4 weeks Call to arrange mammogram Apply for orange card and [facility]. We will let you know if we need to do anything different after the labs results are in.

## 2016-01-20 NOTE — Progress Notes (Signed)
Patient is here for 3 month FU  Patient complains of having aching pain all over.  Patient complains of bulge on left side of back and neck has swelling.

## 2016-02-20 ENCOUNTER — Encounter (HOSPITAL_COMMUNITY): Payer: Self-pay | Admitting: Emergency Medicine

## 2016-02-20 ENCOUNTER — Emergency Department (HOSPITAL_COMMUNITY)
Admission: EM | Admit: 2016-02-20 | Discharge: 2016-02-20 | Disposition: A | Discharge status: Home / Self Care | Payer: Self-pay | Attending: Emergency Medicine | Admitting: Emergency Medicine

## 2016-02-20 ENCOUNTER — Emergency Department (HOSPITAL_COMMUNITY): Payer: Self-pay

## 2016-02-20 DIAGNOSIS — M545 Low back pain, unspecified: Secondary | ICD-10-CM

## 2016-02-20 DIAGNOSIS — Z79899 Other long term (current) drug therapy: Secondary | ICD-10-CM | POA: Insufficient documentation

## 2016-02-20 DIAGNOSIS — I10 Essential (primary) hypertension: Secondary | ICD-10-CM | POA: Insufficient documentation

## 2016-02-20 DIAGNOSIS — Z79891 Long term (current) use of opiate analgesic: Secondary | ICD-10-CM | POA: Insufficient documentation

## 2016-02-20 DIAGNOSIS — Z96643 Presence of artificial hip joint, bilateral: Secondary | ICD-10-CM | POA: Insufficient documentation

## 2016-02-20 DIAGNOSIS — Z791 Long term (current) use of non-steroidal anti-inflammatories (NSAID): Secondary | ICD-10-CM | POA: Insufficient documentation

## 2016-02-20 DIAGNOSIS — K219 Gastro-esophageal reflux disease without esophagitis: Secondary | ICD-10-CM | POA: Insufficient documentation

## 2016-02-20 DIAGNOSIS — E039 Hypothyroidism, unspecified: Secondary | ICD-10-CM | POA: Insufficient documentation

## 2016-02-20 DIAGNOSIS — M1990 Unspecified osteoarthritis, unspecified site: Secondary | ICD-10-CM | POA: Insufficient documentation

## 2016-02-20 DIAGNOSIS — R350 Frequency of micturition: Secondary | ICD-10-CM | POA: Insufficient documentation

## 2016-02-20 LAB — URINALYSIS, ROUTINE W REFLEX MICROSCOPIC
BILIRUBIN URINE: NEGATIVE
Glucose, UA: NEGATIVE mg/dL
Hgb urine dipstick: NEGATIVE
KETONES UR: NEGATIVE mg/dL
Leukocytes, UA: NEGATIVE
Nitrite: NEGATIVE
PH: 7 (ref 5.0–8.0)
PROTEIN: NEGATIVE mg/dL
Specific Gravity, Urine: 1.01 (ref 1.005–1.030)

## 2016-02-20 LAB — COMPREHENSIVE METABOLIC PANEL
ALBUMIN: 4 g/dL (ref 3.5–5.0)
ALK PHOS: 61 U/L (ref 38–126)
ALT: 12 U/L — AB (ref 14–54)
ANION GAP: 11 (ref 5–15)
AST: 18 U/L (ref 15–41)
BUN: 16 mg/dL (ref 6–20)
CALCIUM: 8.3 mg/dL — AB (ref 8.9–10.3)
CHLORIDE: 100 mmol/L — AB (ref 101–111)
CO2: 27 mmol/L (ref 22–32)
Creatinine, Ser: 0.7 mg/dL (ref 0.44–1.00)
GFR calc Af Amer: >60 mL/min (ref >60)
GFR calc non Af Amer: >60 mL/min (ref >60)
GLUCOSE: 101 mg/dL — AB (ref 65–99)
Potassium: 3.7 mmol/L (ref 3.5–5.1)
SODIUM: 138 mmol/L (ref 135–145)
Total Bilirubin: 0.4 mg/dL (ref 0.3–1.2)
Total Protein: 7.4 g/dL (ref 6.5–8.1)

## 2016-02-20 LAB — CBC
HEMATOCRIT: 37 % (ref 36.0–46.0)
HEMOGLOBIN: 11.9 g/dL — AB (ref 12.0–15.0)
MCH: 26.2 pg (ref 26.0–34.0)
MCHC: 32.2 g/dL (ref 30.0–36.0)
MCV: 81.5 fL (ref 78.0–100.0)
Platelets: 383 10*3/uL (ref 150–400)
RBC: 4.54 MIL/uL (ref 3.87–5.11)
RDW: 16.9 % — ABNORMAL HIGH (ref 11.5–15.5)
WBC: 9.5 10*3/uL (ref 4.0–10.5)

## 2016-02-20 LAB — LIPASE, BLOOD: LIPASE: 19 U/L (ref 11–51)

## 2016-02-20 MED ORDER — BUPIVACAINE HCL (PF) 0.5 % IJ SOLN
10.0000 mL | Freq: Once | INTRAMUSCULAR | Status: AC
  Administered 2016-02-20: 10 mL
  Filled 2016-02-20: qty 30
  Filled 2016-02-20: qty 10

## 2016-02-20 MED ORDER — NAPROXEN 500 MG PO TABS: 500.0000 mg | ORAL_TABLET | Freq: Two times a day (BID) | ORAL | Status: DC

## 2016-02-20 MED ORDER — HYDROCODONE-ACETAMINOPHEN 5-325 MG PO TABS
1.0000 | ORAL_TABLET | Freq: Once | ORAL | Status: AC
  Administered 2016-02-20: 1 via ORAL
  Filled 2016-02-20: qty 1

## 2016-02-20 NOTE — Discharge Instructions (Signed)
Ejercicios para la espalda °(Back Exercises) °Los siguientes ejercicios fortalecen los músculos que dan soporte a la espalda y, además, ayudan a mantener la flexibilidad de la zona lumbar. Hacer estos ejercicios puede ser de ayuda para evitar el dolor de espalda o aliviar el dolor actual. °Si tiene dolor o molestias en la espalda, intente hacer estos ejercicios 2 o 3 veces por día, o como se lo haya indicado el médico. Cuando el dolor desaparezca, hágalos una vez por día, pero haga más repeticiones de cada ejercicio. Si no tiene dolor o molestias en la espalda, haga estos ejercicios una vez por día o como se lo haya indicado el médico. °EJERCICIOS °Rodilla al pecho °Repita estos pasos 3 o 5 veces con cada pierna: °1. Acuéstese boca arriba sobre una cama dura o sobre el suelo con las piernas extendidas. °2. Lleve una rodilla al pecho. La otra pierna debe quedar extendida y en contacto con el suelo. °3. Mantenga la rodilla contra el pecho. Para lograrlo tómese la rodilla o el muslo. °4. Tire de la rodilla hasta sentir una elongación suave en la parte baja de la espalda. °5. Mantenga la elongación durante 10 a 30 segundos. °6. Suelte y extienda la pierna lentamente. °Inclinación de la pelvis °Repita estos pasos 5 o 10 veces: °1. Acuéstese boca arriba sobre una cama dura o sobre el suelo con las piernas extendidas. °2. Flexione las rodillas de modo que apunten al techo y los pies queden apoyados en el suelo. °3. Contraiga los músculos de la parte baja del abdomen para empujar la zona lumbar contra el suelo. Con este movimiento se inclinará la pelvis de modo que el cóccix apunte hacia el techo, en lugar de apuntar en dirección a los pies o al suelo. °4. Contraiga suavemente y respire con normalidad mientras mantiene esta posición durante 5 a 10 segundos. °El perro y el gato °Repita estos pasos hasta que la zona lumbar se vuelva más flexible: °1. Apoye las palmas de las manos y las rodillas sobre una superficie firme. Las  manos deben estar alineadas con los hombros y las rodillas con las caderas. Puede colocarse almohadillas debajo de las rodillas para estar cómodo. °2. Deje caer la cabeza y baje el cóccix en dirección al suelo de modo que la zona lumbar se arquee como el lomo de un gato asustado. °3. Mantenga esta posición durante 5 segundos. °4. Lentamente, levante la cabeza y eleve el cóccix de modo que apunte en dirección al techo para que la espalda forme un arco hundido como el lomo de un perro contento. °5. Mantenga esta posición durante 5 segundos. °Flexiones de brazos °Repita estos pasos 5 o 10 veces: °1. Acuéstese boca abajo en el suelo. °2. Coloque las palmas de las manos cerca de la cabeza, separadas aproximadamente al ancho de los hombros. °3. Con la espalda lo más relajada posible y las caderas apoyadas en el suelo, extienda lentamente los brazos para levantar la mitad superior del cuerpo y elevar los hombros. No use los músculos de la espalda para elevar la parte superior del torso. Puede cambiar las manos de lugar para estar más cómodo. °4. Mantenga esta posición durante 5 segundos mientras mantiene la espalda relajada. °5. Lentamente vuelva a la posición horizontal. °Puentes °Repita estos pasos 10 veces: °1. Acuéstese boca arriba sobre una superficie firme. °2. Flexione las rodillas de modo que apunten al techo y los pies queden apoyados en el suelo. °3. Contraiga los glúteos y despegue las nalgas del suelo hasta que la cintura esté casi a la misma altura que las rodillas. Debe   sentir el trabajo muscular en las nalgas y la parte posterior de los muslos. Si no siente el esfuerzo de estos músculos, aleje los pies 1 o 2 pulgadas (2,5 o 5 centímetros) de las nalgas. °4. Mantenga esta posición durante 3 a 5 segundos. °5. Baje lentamente las caderas a la posición inicial y relaje los glúteos por completo. °Si este ejercicio le resulta muy fácil, intente realizarlo con los brazos cruzados sobre el  pecho. °Abdominales °Repita estos pasos 5 o 10 veces: °1. Acuéstese boca arriba sobre una cama dura o sobre el suelo con las piernas extendidas. °2. Flexione las rodillas de modo que apunten al techo y los pies queden apoyados en el suelo. °3. Cruce los brazos sobre el pecho. °4. Baje levemente el mentón en dirección al pecho sin doblar el cuello. °5. Contraiga los músculos del abdomen y con lentitud eleve el torso lo suficiente como para despegar levemente los omóplatos del suelo. No eleve el torso más alto que eso, porque esto puede sobreexigir a la zona lumbar y no ayuda a fortalecer los músculos abdominales. °6. Regrese lentamente a la posición inicial. °Elevaciones de espalda °Repita estos pasos 5 o 10 veces: °1. Acuéstese boca abajo con los brazos a los costados del cuerpo y apoye la frente en el suelo. °2. Contraiga los músculos de las piernas y los glúteos. °3. Lentamente despegue el pecho del suelo mientras mantiene las caderas bien apoyadas en el suelo. Mantenga la nuca alineada con la curvatura de la espalda. Los ojos deben mirar al suelo. °4. Mantenga esta posición durante 3 a 5 segundos. °5. Regrese lentamente a la posición inicial. °SOLICITE ATENCIÓN MÉDICA SI: °· El dolor o las molestias en la espalda se vuelven mucho más intensos cuando hace un ejercicio. °· El dolor o las molestias en la espalda no se alivian en el término de las 2 horas posteriores a hacer los ejercicios. °Si tiene alguno de estos problemas, deje de hacer los ejercicios de inmediato. No vuelva a hacer los ejercicios a menos que el médico lo autorice. °SOLICITE ATENCIÓN MÉDICA DE INMEDIATO SI: °· Siente un dolor súbito e intenso en la espalda. Si esto ocurre, deje de hacer los ejercicios de inmediato. No vuelva a hacer los ejercicios a menos que el médico lo autorice. °  °Esta información no tiene como fin reemplazar el consejo del médico. Asegúrese de hacerle al médico cualquier pregunta que tenga. °  °Document Released: 10/15/2005  Document Revised: 07/06/2015 °Elsevier Interactive Patient Education ©2016 [employer]vier Inc. ° °

## 2016-02-20 NOTE — ED Notes (Addendum)
Right lower flank pain starting this morning, radiating into stomach. Has not tried OTC meds to help. C/o nausea, no vomiting, no fever. Feels like she has to urinate frequently but only goes a small amount

## 2016-02-20 NOTE — ED Notes (Signed)
Pt wheeled out and discharged with her son.

## 2016-02-21 NOTE — ED Provider Notes (Signed)
CSN: [id number]     Arrival date & time 02/20/16  1341 History   First MD Initiated Contact with Patient 02/20/16 1759     Chief Complaint  Patient presents with  . Flank Pain  . Urinary Frequency     (Consider location/radiation/quality/duration/timing/severity/associated sxs/prior Treatment) Patient is a 65 y.o. female presenting with flank pain and frequency. The history is provided by the patient.  Flank Pain This is a new problem. The current episode started 12 to 24 hours ago. The problem occurs constantly. The problem has not changed since onset.Pertinent negatives include no chest pain and no abdominal pain. The symptoms are aggravated by twisting and walking. Nothing relieves the symptoms. She has tried nothing for the symptoms.  Urinary Frequency Pertinent negatives include no chest pain and no abdominal pain.    Past Medical History  Diagnosis Date  . Hypothyroidism   . Hypertension   . Varicose vein of leg   . Osteoarthritis   . GERD (gastroesophageal reflux disease)     occasional prilosec   Past Surgical History  Procedure Laterality Date  . Hip surgery  2008-9    right  . Hip surgery   2014    left   . Joint replacement      bil total hips  . Thyroidectomy N/A 11/18/2014    Procedure: TOTAL THYROIDECTOMY;  Surgeon: Darnell Level, MD;  Location: [facility];  Service: General;  Laterality: N/A;   Family History  Problem Relation Age of Onset  . High blood pressure     Social History  Substance Use Topics  . Smoking status: Never Smoker   . Smokeless tobacco: Never Used  . Alcohol Use: Yes   OB History    No data available     Review of Systems  Cardiovascular: Negative for chest pain.  Gastrointestinal: Negative for abdominal pain.  Genitourinary: Positive for frequency and flank pain.  All other systems reviewed and are negative.     Allergies  Review of patient's allergies indicates no known allergies.  Home Medications   Prior to Admission  medications   Medication Sig Start Date End Date Taking? Authorizing Provider  calcium-vitamin D (OSCAL WITH D) 500-200 MG-UNIT tablet Take 2 tablets by mouth 3 (three) times daily. 01/20/16  Yes Henrietta Hoover, NP  levothyroxine (SYNTHROID, LEVOTHROID) 100 MCG tablet Take 1 tablet (100 mcg total) by mouth daily. 01/20/16  Yes Henrietta Hoover, NP  omeprazole (PRILOSEC OTC) 20 MG tablet Take 1 tablet (20 mg total) by mouth daily. 01/20/16  Yes Henrietta Hoover, NP  naproxen (NAPROSYN) 500 MG tablet Take 1 tablet (500 mg total) by mouth 2 (two) times daily with a meal. 02/20/16   Lyndal Pulley, MD  oxyCODONE-acetaminophen (PERCOCET/ROXICET) 5-325 MG tablet Take 1 tablet by mouth every 6 (six) hours as needed for severe pain. Patient not taking: Reported on 02/20/2016 11/29/15   Courteney Lyn Mackuen, MD  ranitidine (ZANTAC) 150 MG tablet Take 1 tablet (150 mg total) by mouth 2 (two) times daily. Patient not taking: Reported on 02/20/2016 06/30/15   Sandford Craze, NP   BP 119/66 mmHg  Pulse 67  Temp(Src) 98.3 F (36.8 C) (Oral)  Resp 16  SpO2 95% Physical Exam  Constitutional: She is oriented to person, place, and time. She appears well-developed and well-nourished. No distress.  HENT:  Head: Normocephalic.  Eyes: Conjunctivae are normal.  Neck: Neck supple. No tracheal deviation present.  Cardiovascular: Normal rate and regular rhythm.   Pulmonary/Chest: Effort  normal. No respiratory distress.  Abdominal: Soft. She exhibits no distension. There is no tenderness. There is no rigidity, no rebound, no guarding and no CVA tenderness.  Musculoskeletal:       Lumbar back: She exhibits tenderness and pain. She exhibits normal range of motion, no bony tenderness and no deformity.       Back:  Neurological: She is alert and oriented to person, place, and time.  Skin: Skin is warm and dry.  Psychiatric: She has a normal mood and affect.    ED Course  Procedures (including critical care  time) Procedure Note: Trigger Point Injection for Myofascial pain  Performed by Dr. Clydene Pugh Indication: muscle/myofascial pain Muscle body and tendon sheath of the right paraspinal muscle(s) were injected with 0.5% bupivacaine under sterile technique for release of muscle spasm/pain. Patient tolerated well with immediate improvement of symptoms and no immediate complications following procedure.  CPT Code:   1 or 2 muscle bodies: 20552  Labs Review Labs Reviewed  COMPREHENSIVE METABOLIC PANEL - Abnormal; Notable for the following:    Chloride 100 (*)    Glucose, Bld 101 (*)    Calcium 8.3 (*)    ALT 12 (*)    All other components within normal limits  CBC - Abnormal; Notable for the following:    Hemoglobin 11.9 (*)    RDW 16.9 (*)    All other components within normal limits  URINE CULTURE  LIPASE, BLOOD  URINALYSIS, ROUTINE W REFLEX MICROSCOPIC (NOT AT [facility])    Imaging Review Ct Renal Stone Study  02/20/2016  CLINICAL DATA:  Patient with right lower flank pain. Nausea. No vomiting. Frequent urination. EXAM: CT ABDOMEN AND PELVIS WITHOUT CONTRAST TECHNIQUE: Multidetector CT imaging of the abdomen and pelvis was performed following the standard protocol without IV contrast. COMPARISON:  CT abdomen pelvis 09/04/2012 FINDINGS: Lower chest: Stable cardiomegaly. Bibasilar atelectasis. No pleural effusion. Small hiatal hernia. Hepatobiliary: Liver is diffusely low in attenuation compatible with hepatic steatosis. Gallbladder is unremarkable. No intrahepatic or extrahepatic biliary ductal dilatation. Pancreas: Unremarkable Spleen: Unremarkable Adrenals/Urinary Tract: The adrenal glands are normal. Kidneys are symmetric in size. No hydronephrosis. No nephrolithiasis. 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 Stomach/Bowel: No abnormal bowel wall thickening or evidence for bowel obstruction. No free fluid or free intraperitoneal air.  Vascular/Lymphatic: Tortuous yet normal caliber abdominal aorta with peripheral calcified atherosclerotic plaque. No retroperitoneal adenopathy. Unable to assess for pelvic adenopathy due to streak artifact. Other: None. Musculoskeletal: Bilateral hip arthroplasties. Lumbar spine degenerative changes. IMPRESSION: No hydronephrosis. No nephrolithiasis. 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 No acute process within the abdomen or pelvis on noncontrast exam. Electronically Signed   By: Annia Belt M.D.   On: 02/20/2016 20:16   I have personally reviewed and evaluated these images and lab results as part of my medical decision-making.   EKG Interpretation None      MDM   Final diagnoses:  Right-sided low back pain without sciatica    65 y.o. female presents with right low back pain sometime moving to her right flank. She is point tender above her right iliac crest at paraspinal musculature in lumbar area. Considered stone with location of pain and screening CT was negative for this. Trigger point injection applied with relief of symptoms. Plan to follow up with PCP as needed and return precautions discussed for worsening or new concerning symptoms.  Lyndal Pulleyaniel Leone Mobley, MD 02/21/16 [id number]

## 2016-02-22 LAB — URINE CULTURE

## 2016-02-24 ENCOUNTER — Ambulatory Visit (INDEPENDENT_AMBULATORY_CARE_PROVIDER_SITE_OTHER): Payer: Self-pay | Admitting: Family Medicine

## 2016-02-24 ENCOUNTER — Other Ambulatory Visit: Payer: Self-pay | Admitting: Family Medicine

## 2016-02-24 DIAGNOSIS — Z0000 Encounter for general adult medical examination without abnormal findings: Secondary | ICD-10-CM

## 2016-02-24 MED ORDER — NAPROXEN 500 MG PO TABS: 500.0000 mg | ORAL_TABLET | Freq: Two times a day (BID) | ORAL | Status: DC

## 2016-02-24 NOTE — Progress Notes (Signed)
Patient ID: Shannon [name], female   DOB: 1951/06/03, 65 y.o.   MRN: [id number]  Patient was scheduled today for PAP smear and other health maintenance issues.  However, she has not yet applied for or obtained and orange card or other coverage and is entirely self pay. She cannot afford to have the needed things done. We have given her and her son information and instructions on applying for orange card. They are to return in one month when this has been accomplished.  Appointment cancelled for today.

## 2016-03-16 ENCOUNTER — Ambulatory Visit: Payer: Self-pay | Admitting: Family Medicine

## 2016-03-30 ENCOUNTER — Ambulatory Visit: Payer: Self-pay | Admitting: Family Medicine

## 2016-06-19 ENCOUNTER — Encounter: Payer: Self-pay | Admitting: Family Medicine

## 2016-06-19 ENCOUNTER — Ambulatory Visit (INDEPENDENT_AMBULATORY_CARE_PROVIDER_SITE_OTHER): Payer: Self-pay | Admitting: Family Medicine

## 2016-06-19 VITALS — BP 135/66 | HR 84 | Temp 99.0°F | Resp 18 | Ht 62.0 in | Wt 272.0 lb

## 2016-06-19 DIAGNOSIS — E039 Hypothyroidism, unspecified: Secondary | ICD-10-CM

## 2016-06-19 DIAGNOSIS — I10 Essential (primary) hypertension: Secondary | ICD-10-CM

## 2016-06-19 DIAGNOSIS — R739 Hyperglycemia, unspecified: Secondary | ICD-10-CM

## 2016-06-19 DIAGNOSIS — M549 Dorsalgia, unspecified: Secondary | ICD-10-CM

## 2016-06-19 DIAGNOSIS — Z1159 Encounter for screening for other viral diseases: Secondary | ICD-10-CM

## 2016-06-19 DIAGNOSIS — Z114 Encounter for screening for human immunodeficiency virus [HIV]: Secondary | ICD-10-CM

## 2016-06-19 DIAGNOSIS — K219 Gastro-esophageal reflux disease without esophagitis: Secondary | ICD-10-CM

## 2016-06-19 DIAGNOSIS — Z1239 Encounter for other screening for malignant neoplasm of breast: Secondary | ICD-10-CM

## 2016-06-19 DIAGNOSIS — Z23 Encounter for immunization: Secondary | ICD-10-CM

## 2016-06-19 LAB — COMPLETE METABOLIC PANEL WITH GFR
ALT: 10 U/L (ref 6–29)
AST: 15 U/L (ref 10–35)
Albumin: 3.6 g/dL (ref 3.6–5.1)
Alkaline Phosphatase: 44 U/L (ref 33–130)
BUN: 18 mg/dL (ref 7–25)
CHLORIDE: 99 mmol/L (ref 98–110)
CO2: 26 mmol/L (ref 20–31)
Calcium: 7.2 mg/dL — ABNORMAL LOW (ref 8.6–10.4)
Creat: 0.76 mg/dL (ref 0.50–0.99)
GFR, EST NON AFRICAN AMERICAN: 83 mL/min (ref >=60)
GLUCOSE: 77 mg/dL (ref 65–99)
Potassium: 3.9 mmol/L (ref 3.5–5.3)
SODIUM: 140 mmol/L (ref 135–146)
Total Bilirubin: 0.3 mg/dL (ref 0.2–1.2)
Total Protein: 6.2 g/dL (ref 6.1–8.1)

## 2016-06-19 LAB — CBC WITH DIFFERENTIAL/PLATELET
BASOS ABS: 0 {cells}/uL (ref 0–200)
BASOS PCT: 0 %
EOS ABS: 216 {cells}/uL (ref 15–500)
Eosinophils Relative: 3 %
HEMATOCRIT: 35.6 % (ref 35.0–45.0)
HEMOGLOBIN: 11.2 g/dL — AB (ref 11.7–15.5)
Lymphocytes Relative: 29 %
Lymphs Abs: 2088 cells/uL (ref 850–3900)
MCH: 26 pg — ABNORMAL LOW (ref 27.0–33.0)
MCHC: 31.5 g/dL — ABNORMAL LOW (ref 32.0–36.0)
MCV: 82.8 fL (ref 80.0–100.0)
MONO ABS: 504 {cells}/uL (ref 200–950)
MPV: 10.3 fL (ref 7.5–12.5)
Monocytes Relative: 7 %
NEUTROS ABS: 4392 {cells}/uL (ref 1500–7800)
Neutrophils Relative %: 61 %
Platelets: 370 10*3/uL (ref 140–400)
RBC: 4.3 MIL/uL (ref 3.80–5.10)
RDW: 17.9 % — ABNORMAL HIGH (ref 11.0–15.0)
WBC: 7.2 10*3/uL (ref 3.8–10.8)

## 2016-06-19 LAB — LIPID PANEL
CHOL/HDL RATIO: 4.5 ratio (ref <=5.0)
Cholesterol: 235 mg/dL — ABNORMAL HIGH (ref 125–200)
HDL: 52 mg/dL (ref >=46)
LDL CALC: 115 mg/dL (ref <130)
Triglycerides: 339 mg/dL — ABNORMAL HIGH (ref <150)
VLDL: 68 mg/dL — AB (ref <30)

## 2016-06-19 LAB — POCT GLYCOSYLATED HEMOGLOBIN (HGB A1C): Hemoglobin A1C: 5.7

## 2016-06-19 MED ORDER — NAPROXEN 500 MG PO TABS: 500.0000 mg | ORAL_TABLET | Freq: Two times a day (BID) | ORAL | 2 refills | Status: AC

## 2016-06-19 MED ORDER — OMEPRAZOLE MAGNESIUM 20 MG PO TBEC: 20.0000 mg | DELAYED_RELEASE_TABLET | Freq: Every day | ORAL | 3 refills | Status: AC

## 2016-06-19 MED ORDER — LEVOTHYROXINE SODIUM 100 MCG PO TABS: 100.0000 ug | ORAL_TABLET | Freq: Every day | ORAL | 3 refills | Status: DC

## 2016-06-19 NOTE — Progress Notes (Signed)
Patient is here for FU  Patient is requesting a refill on Prilosec, Ibuprofen and Levothryoxine.  Patient denies pain at this time.  Patient has taken medication.   Patient tolerated flu and tdap injections well today.

## 2016-06-20 ENCOUNTER — Telehealth: Payer: Self-pay

## 2016-06-20 LAB — HIV ANTIBODY (ROUTINE TESTING W REFLEX): HIV 1&2 Ab, 4th Generation: NONREACTIVE

## 2016-06-20 LAB — T3, FREE: T3, Free: 1.3 pg/mL — ABNORMAL LOW (ref 2.3–4.2)

## 2016-06-20 LAB — HEPATITIS C ANTIBODY: HCV AB: NEGATIVE

## 2016-06-20 LAB — TSH: TSH: 71.22 m[IU]/L — AB

## 2016-06-20 LAB — T4, FREE: Free T4: 0.4 ng/dL — ABNORMAL LOW (ref 0.8–1.8)

## 2016-06-20 MED ORDER — LEVOTHYROXINE SODIUM 100 MCG PO TABS: 100.0000 ug | ORAL_TABLET | Freq: Every day | ORAL | 3 refills | Status: AC

## 2016-06-20 NOTE — Patient Instructions (Signed)
Return in 6 months

## 2016-06-20 NOTE — Progress Notes (Signed)
Shannon [name], is a 65 y.o. female  YQM:[id number]:[id number]  BMW:[id number]  DOB - [birth year]  CC:  Chief Complaint  Patient presents with  . Follow-up       HPI: Shannon [name] is a 65 y.o. female here for follow-up of chronic conditions and health maintenance. She has a history of hypothyroidism and is supposed to be on synthroid 100 q day but ran out a few weeks ago. She also has a history of hypertension, GERD, osteoarthritis. Other diagnoses are listed in her past medical history. She reports being on calcium supplement, naproxen and prilosec. She is not currently on anything for BP.  Health maintenance: Tdap UTD, Declines influenza, Needs HIV Had a PAP in January. She is due for mammogram. She is alo due for screening for diabetes with A1C.  No Known Allergies Past Medical History:  Diagnosis Date  . GERD (gastroesophageal reflux disease)    occasional prilosec  . Hypertension   . Hypothyroidism   . Osteoarthritis   . Varicose vein of leg    Current Outpatient Prescriptions on File Prior to Visit  Medication Sig Dispense Refill  . calcium-vitamin D (OSCAL WITH D) 500-200 MG-UNIT tablet Take 2 tablets by mouth 3 (three) times daily. 60 tablet 1  . oxyCODONE-acetaminophen (PERCOCET/ROXICET) 5-325 MG tablet Take 1 tablet by mouth every 6 (six) hours as needed for severe pain. (Patient not taking: Reported on 02/20/2016) 11 tablet 0  . ranitidine (ZANTAC) 150 MG tablet Take 1 tablet (150 mg total) by mouth 2 (two) times daily. (Patient not taking: Reported on 02/20/2016) 60 tablet 0   No current facility-administered medications on file prior to visit.    Family History  Problem Relation Age of Onset  . High blood pressure     Social History   Social History  . Marital status: Widowed    Spouse name: N/A  . Number of children: N/A  . Years of education: N/A   Occupational History  . Not on file.   Social History Main Topics  . Smoking status: Never Smoker   . Smokeless tobacco: Never Used  . Alcohol use Yes  . Drug use: No  . Sexual activity: No   Other Topics Concern  . Not on file   Social History Narrative   Ten children- 4 liven in [place], 6 in [place].  13 grandchildren, 5 great grand children   From [place]   She has lived here for 17 years   She has not worked recently due to hip problems.  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 Skin: Negative for rashes or lesions of concern. HENT: Negative for ear pain, ear discharge.nose bleeds Eyes: Negative for pain, discharge, redness, itching and visual disturbance. Neck: Negative for pain, stiffness Respiratory: Negative for cough, shortness of breath,   Cardiovascular: Negative for chest pain, palpitations and leg swelling. Gastrointestinal: Negative for abdominal pain, nausea, vomiting, diarrhea. Positive for constipation and heartburn. Genitourinary: Negative for dysuria, urgency, frequency, hematuria,  Musculoskeletal: Positive for back pain, Negative for otherjoint pain, joint  swelling, and gait problem.Negative for weakness. Neurological: Negative for dizziness, tremors, seizures, syncope,   light-headedness, numbness and headaches.  Hematological: Negative for easy bruising or bleeding Psychiatric/Behavioral: Negative for depression, anxiety, decreased concentration, confusion  Objective:   Vitals:   06/19/16 1413  BP: 135/66  Pulse: 84  Resp: 18  Temp: 99 F (37.2 C)    Physical Exam: Constitutional: Patient appears well-developed and well-nourished. No distress. HENT: Normocephalic, atraumatic, External right and left ear normal. Oropharynx is clear and moist.  Eyes: Conjunctivae and EOM are normal. PERRLA, no scleral icterus. Neck: Normal ROM. Neck supple. No  lymphadenopathy, No thyromegaly. CVS: RRR, S1/S2 +, no murmurs, no gallops, no rubs Pulmonary: Effort and breath sounds normal, no stridor, rhonchi, wheezes, rales.  Abdominal: Soft. Normoactive BS,, no distension, tenderness, rebound or guarding.  Musculoskeletal: Normal range of motion. No edema and no tenderness.  Neuro: Alert.Normal muscle tone coordination. Non-focal Skin: Skin is warm and dry. No rash noted. Not diaphoretic. No erythema. No pallor. Psychiatric: Normal mood and affect. Behavior, judgment, thought content normal.  Lab Results  Component Value Date   WBC 7.2 06/19/2016   HGB 11.2 (L) 06/19/2016   HCT 35.6 06/19/2016   MCV 82.8 06/19/2016   PLT 370 06/19/2016   Lab Results  Component Value Date   CREATININE 0.76 06/19/2016   BUN 18 06/19/2016   NA 140 06/19/2016   K 3.9 06/19/2016   CL 99 06/19/2016   CO2 26 06/19/2016    Lab Results  Component Value Date   HGBA1C 5.7 06/19/2016   Lipid Panel     Component Value Date/Time   CHOL 235 (H) 06/19/2016 1449   TRIG 339 (H) 06/19/2016 1449   HDL 52 06/19/2016 1449   CHOLHDL 4.5 06/19/2016 1449   VLDL 68 (H) 06/19/2016 1449   LDLCALC 115 06/19/2016 1449       Assessment and plan:   1. Hyperglycemia  - HgB A1c  2. Essential hypertension  - COMPLETE METABOLIC PANEL WITH GFR - CBC with Differential - Lipid panel - TSH - T4, Free - T3, Free  3. Screening for HIV (human immunodeficiency virus)  - HIV antibody (with reflex)  4. Need for hepatitis C screening test  - Hepatitis C Antibody  5. Need for prophylactic vaccination and inoculation against influenza  - Flu Vaccine QUAD 36+ mos PF IM (Fluarix & Fluzone Quad PF)  6. Need for Tdap vaccination  - POC Hemoccult Bld/Stl (3-Cd Home Screen); Future  7. Hypothyroidism, unspecified hypothyroidism type  - levothyroxine (SYNTHROID, LEVOTHROID) 100 MCG tablet; Take 1 tablet (100 mcg total) by mouth daily.  Dispense: 30 tablet; Refill:  3  8. Gastroesophageal reflux disease, esophagitis presence not specified  - omeprazole (PRILOSEC OTC) 20 MG tablet; Take 1 tablet (20 mg total) by mouth daily.  Dispense: 30 tablet; Refill: 3  9. Back pain, unspecified location  - naproxen (NAPROSYN) 500 MG tablet; Take 1 tablet (500 mg total) by mouth 2 (two) times daily with a meal.  Dispense: 30 tablet; Refill: 2  10. Breast cancer screening  - MM DIGITAL SCREENING BILATERAL; Future   Return in about 6 months (around 12/20/2016).  The patient was given clear instructions to go to ER or return to medical center if symptoms don't improve, worsen or new problems develop. The patient verbalized understanding.    Henrietta HooverLinda C Yaelis Scharfenberg FNP  06/20/2016, 2:44 PM

## 2016-06-20 NOTE — Telephone Encounter (Signed)
Called using interpreter ID [phone]. Patient was not home. Asked room mate to have patient call back when available. Thanks!

## 2016-06-20 NOTE — Telephone Encounter (Signed)
Patient returned call and I advised of labs. Patient has been off of thyroid medication x 2 weeks. Shannon [name] can you please advise what strength of medication she needs and have this sent into [facility] in [place], [place]? Please advise. Thanks!

## 2016-06-20 NOTE — Telephone Encounter (Signed)
-----   Message from Henrietta HooverLinda C Bernhardt, NP sent at 06/20/2016  8:13 AM EDT ----- T3 1.3 (2.3-4.2), TSH 71.2 T40.4 (0.8-1.8) HIV negative, HCV negative. Cmet accpt.Lipids OK. HBG 11.2  I AM UNSURE IF SHE HAS BEEN TAKING HER SYNTHROID. IF SHE HAS BEEN TAKING, WE NEED TO INCREASE.

## 2016-06-21 ENCOUNTER — Telehealth: Payer: Self-pay | Admitting: *Deleted

## 2016-06-21 NOTE — Telephone Encounter (Signed)
error 

## 2016-07-07 DIAGNOSIS — M79604 Pain in right leg: Secondary | ICD-10-CM

## 2016-07-07 DIAGNOSIS — R079 Chest pain, unspecified: Secondary | ICD-10-CM

## 2016-07-07 DIAGNOSIS — K219 Gastro-esophageal reflux disease without esophagitis: Secondary | ICD-10-CM

## 2016-07-07 DIAGNOSIS — E039 Hypothyroidism, unspecified: Secondary | ICD-10-CM

## 2016-07-07 DIAGNOSIS — I10 Essential (primary) hypertension: Secondary | ICD-10-CM

## 2016-09-19 ENCOUNTER — Ambulatory Visit: Payer: Self-pay | Admitting: Family Medicine

## 2016-09-28 NOTE — Progress Notes (Signed)
Shannon [name], is a 65 y.o. female  ZOX:[id number]:[id number]  [id number]  DOB - January 29, 1951  CC:  Chief Complaint  Patient presents with  . Follow-up       HPI: Shannon [name] is a 65 y.o. female here for follow-up hypertension, GERD, hypothyroidism, osteoarthritis. She has a number of health maintenance issues that need addressing. She is not on medication for hypertension at this time. She is on levothyroid. She is on zantac for GERD.   No Known Allergies Past Medical History:  Diagnosis Date  . GERD (gastroesophageal reflux disease)    occasional prilosec  . Hypertension   . Hypothyroidism   . Osteoarthritis   . Varicose vein of leg    Current Outpatient Prescriptions on File Prior to Visit  Medication Sig Dispense Refill  . calcium-vitamin D (OSCAL WITH D) 500-200 MG-UNIT tablet Take 2 tablets by mouth 3 (three) times daily. 60 tablet 1  . oxyCODONE-acetaminophen (PERCOCET/ROXICET) 5-325 MG tablet Take 1 tablet by mouth every 6 (six) hours as needed for severe pain. (Patient not taking: Reported on 02/20/2016) 11 tablet 0  . ranitidine (ZANTAC) 150 MG tablet Take 1 tablet (150 mg total) by mouth 2 (two) times daily. (Patient not taking: Reported on 02/20/2016) 60 tablet 0   No current facility-administered medications on file prior to visit.    Family History  Problem Relation Age of Onset  . High blood pressure     Social History   Social History  . Marital status: Widowed    Spouse name: N/A  . Number of children: N/A  . Years of education: N/A   Occupational History  . Not on file.   Social History Main Topics  . Smoking status: Never Smoker  . Smokeless tobacco: Never Used  . Alcohol use Yes  . Drug use: No  . Sexual activity: No   Other Topics Concern  . Not on file   Social History Narrative   Ten children- 4 liven in [place], 6 in [place].  13 grandchildren, 5 great grand children   From [place]   She has lived here for 17 years   She has  not worked recently due to hip problems.  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, [employer]- packaging   Widowed   Lives with son aurelio             Review of Systems: Constitutional: Negative Skin: Negative HENT: Negative  Eyes: Negative  Neck: Negative Respiratory: Negative Cardiovascular: Negative Gastrointestinal: Negative Genitourinary: Negative  Musculoskeletal: + for multiple joint pain and back pain Neurological: Negative for Hematological: Negative  Psychiatric/Behavioral: Negative    Objective:   Vitals:   06/19/16 1413  BP: 135/66  Pulse: 84  Resp: 18  Temp: 99 F (37.2 C)    Physical Exam: Constitutional: Patient appears well-developed and well-nourished. No distress. HENT: Normocephalic, atraumatic, External right and left ear normal. Oropharynx is clear and moist.  Eyes: Conjunctivae and EOM are normal. PERRLA, no scleral icterus. Neck: Normal ROM. Neck supple. No lymphadenopathy, No thyromegaly. CVS: RRR, S1/S2 +, no murmurs, no gallops, no rubs Pulmonary: Effort and breath sounds normal, no stridor, rhonchi, wheezes, rales.  Abdominal: Soft. Normoactive BS,, no distension, tenderness, rebound or guarding.  Musculoskeletal: Normal range of motion. No edema and no tenderness.  Neuro: Alert.Normal muscle tone coordination. Non-focal Skin: Skin is warm and dry. No rash noted. Not diaphoretic. No erythema. No pallor. Psychiatric: Normal mood and affect. Behavior, judgment, thought content  normal.  Lab Results  Component Value Date   WBC 7.2 06/19/2016   HGB 11.2 (L) 06/19/2016   HCT 35.6 06/19/2016   MCV 82.8 06/19/2016   PLT 370 06/19/2016   Lab Results  Component Value Date   CREATININE 0.76 06/19/2016   BUN 18 06/19/2016   NA 140 06/19/2016   K 3.9 06/19/2016   CL 99 06/19/2016   CO2 26 06/19/2016    Lab Results  Component Value Date   HGBA1C 5.7 06/19/2016   Lipid Panel     Component Value Date/Time    CHOL 235 (H) 06/19/2016 1449   TRIG 339 (H) 06/19/2016 1449   HDL 52 06/19/2016 1449   CHOLHDL 4.5 06/19/2016 1449   VLDL 68 (H) 06/19/2016 1449   LDLCALC 115 06/19/2016 1449        Assessment and plan:   1. Hyperglycemia  - HgB A1c  2. Essential hypertension  - COMPLETE METABOLIC PANEL WITH GFR - CBC with Differential - Lipid panel - TSH - T4, Free - T3, Free  3. Screening for HIV (human immunodeficiency virus)  - HIV antibody (with reflex)  4. Need for hepatitis C screening test  - Hepatitis C Antibody  5. Need for prophylactic vaccination and inoculation against influenza  - Flu Vaccine QUAD 36+ mos PF IM (Fluarix & Fluzone Quad PF)  6. Need for Tdap vaccination  - POC Hemoccult Bld/Stl (3-Cd Home Screen); Future  7. Hypothyroidism, unspecified hypothyroidism type  - levothyroxine (SYNTHROID, LEVOTHROID) 100 MCG tablet; Take 1 tablet (100 mcg total) by mouth daily.  Dispense: 30 tablet; Refill: 3  8. Gastroesophageal reflux disease, esophagitis presence not specified  - omeprazole (PRILOSEC OTC) 20 MG tablet; Take 1 tablet (20 mg total) by mouth daily.  Dispense: 30 tablet; Refill: 3  9. Back pain, unspecified location  - naproxen (NAPROSYN) 500 MG tablet; Take 1 tablet (500 mg total) by mouth 2 (two) times daily with a meal.  Dispense: 30 tablet; Refill: 2  10. Breast cancer screening  - MM DIGITAL SCREENING BILATERAL; Future   Return in about 6 months (around 12/20/2016).  The patient was given clear instructions to go to ER or return to medical center if symptoms don't improve, worsen or new problems develop. The patient verbalized understanding.    Henrietta HooverLinda C Voshon Petro FNP  09/28/2016, 4:10 PM

## 2016-10-10 ENCOUNTER — Encounter (HOSPITAL_COMMUNITY): Payer: Self-pay | Admitting: Emergency Medicine

## 2016-10-10 ENCOUNTER — Emergency Department (HOSPITAL_COMMUNITY): Payer: Self-pay

## 2016-10-10 ENCOUNTER — Emergency Department (HOSPITAL_COMMUNITY)
Admission: EM | Admit: 2016-10-10 | Discharge: 2016-10-10 | Disposition: A | Discharge status: Home / Self Care | Payer: Self-pay | Attending: Emergency Medicine | Admitting: Emergency Medicine

## 2016-10-10 DIAGNOSIS — I10 Essential (primary) hypertension: Secondary | ICD-10-CM | POA: Insufficient documentation

## 2016-10-10 DIAGNOSIS — E039 Hypothyroidism, unspecified: Secondary | ICD-10-CM | POA: Insufficient documentation

## 2016-10-10 DIAGNOSIS — Z96643 Presence of artificial hip joint, bilateral: Secondary | ICD-10-CM | POA: Insufficient documentation

## 2016-10-10 DIAGNOSIS — Z79899 Other long term (current) drug therapy: Secondary | ICD-10-CM | POA: Insufficient documentation

## 2016-10-10 DIAGNOSIS — R1032 Left lower quadrant pain: Secondary | ICD-10-CM

## 2016-10-10 DIAGNOSIS — K5900 Constipation, unspecified: Secondary | ICD-10-CM | POA: Insufficient documentation

## 2016-10-10 LAB — URINALYSIS, ROUTINE W REFLEX MICROSCOPIC
Bilirubin Urine: NEGATIVE
GLUCOSE, UA: NEGATIVE mg/dL
Hgb urine dipstick: NEGATIVE
KETONES UR: 5 mg/dL — AB
Leukocytes, UA: NEGATIVE
NITRITE: NEGATIVE
PH: 5 (ref 5.0–8.0)
Protein, ur: 30 mg/dL — AB
SPECIFIC GRAVITY, URINE: 1.028 (ref 1.005–1.030)

## 2016-10-10 LAB — CBC WITH DIFFERENTIAL/PLATELET
Basophils Absolute: 0 10*3/uL (ref 0.0–0.1)
Basophils Relative: 1 %
Eosinophils Absolute: 0.1 10*3/uL (ref 0.0–0.7)
Eosinophils Relative: 2 %
HEMATOCRIT: 36.7 % (ref 36.0–46.0)
HEMOGLOBIN: 11.8 g/dL — AB (ref 12.0–15.0)
LYMPHS ABS: 1.2 10*3/uL (ref 0.7–4.0)
Lymphocytes Relative: 29 %
MCH: 26.1 pg (ref 26.0–34.0)
MCHC: 32.2 g/dL (ref 30.0–36.0)
MCV: 81.2 fL (ref 78.0–100.0)
MONO ABS: 0.7 10*3/uL (ref 0.1–1.0)
MONOS PCT: 18 %
NEUTROS ABS: 2 10*3/uL (ref 1.7–7.7)
NEUTROS PCT: 50 %
Platelets: 364 10*3/uL (ref 150–400)
RBC: 4.52 MIL/uL (ref 3.87–5.11)
RDW: 15.2 % (ref 11.5–15.5)
WBC: 4 10*3/uL (ref 4.0–10.5)

## 2016-10-10 LAB — COMPREHENSIVE METABOLIC PANEL
ALK PHOS: 48 U/L (ref 38–126)
ALT: 16 U/L (ref 14–54)
ANION GAP: 9 (ref 5–15)
AST: 21 U/L (ref 15–41)
Albumin: 3.7 g/dL (ref 3.5–5.0)
BILIRUBIN TOTAL: 0.3 mg/dL (ref 0.3–1.2)
BUN: 7 mg/dL (ref 6–20)
CALCIUM: 7.7 mg/dL — AB (ref 8.9–10.3)
CO2: 27 mmol/L (ref 22–32)
Chloride: 97 mmol/L — ABNORMAL LOW (ref 101–111)
Creatinine, Ser: 0.58 mg/dL (ref 0.44–1.00)
GLUCOSE: 96 mg/dL (ref 65–99)
POTASSIUM: 3.2 mmol/L — AB (ref 3.5–5.1)
Sodium: 133 mmol/L — ABNORMAL LOW (ref 135–145)
TOTAL PROTEIN: 6.6 g/dL (ref 6.5–8.1)

## 2016-10-10 LAB — LIPASE, BLOOD: LIPASE: 16 U/L (ref 11–51)

## 2016-10-10 MED ORDER — ONDANSETRON HCL 4 MG/2ML IJ SOLN
4.0000 mg | Freq: Once | INTRAMUSCULAR | Status: AC
  Administered 2016-10-10: 4 mg via INTRAVENOUS
  Filled 2016-10-10: qty 2

## 2016-10-10 MED ORDER — MAGNESIUM CITRATE PO SOLN: 1.0000 | Freq: Once | ORAL | 0 refills | Status: AC

## 2016-10-10 MED ORDER — SODIUM CHLORIDE 0.9 % IJ SOLN
INTRAMUSCULAR | Status: AC
  Filled 2016-10-10: qty 50

## 2016-10-10 MED ORDER — MORPHINE SULFATE (PF) 4 MG/ML IV SOLN
4.0000 mg | Freq: Once | INTRAVENOUS | Status: AC
Start: 2016-10-10 — End: 2016-10-10
  Administered 2016-10-10: 4 mg via INTRAVENOUS
  Filled 2016-10-10: qty 1

## 2016-10-10 MED ORDER — POLYETHYLENE GLYCOL 3350 17 GM/SCOOP PO POWD: 17.0000 g | Freq: Every day | ORAL | 0 refills | Status: AC

## 2016-10-10 MED ORDER — IOPAMIDOL (ISOVUE-300) INJECTION 61%
100.0000 mL | Freq: Once | INTRAVENOUS | Status: AC | PRN
  Administered 2016-10-10: 100 mL via INTRAVENOUS

## 2016-10-10 MED ORDER — IOPAMIDOL (ISOVUE-300) INJECTION 61%
INTRAVENOUS | Status: AC
  Filled 2016-10-10: qty 100

## 2016-10-10 NOTE — ED Provider Notes (Signed)
[facility]-EMERGENCY DEPT Provider Note   CSN: [id number] Arrival date & time: 10/10/16  1322     History   Chief Complaint Chief Complaint  Patient presents with  . Flank Pain    HPI Shannon [name] is a 65 y.o. female.  HPI  65 year old female who presents with left lower quadrant abdominal pain. She has a history of hypertension and no prior abdominal surgeries. States 2 weeks of intermittent, but now constant left lower quadrant abdominal pain associated with constipation. No nausea or vomiting, fevers or chills, dysuria, or hematuria. States that she was seen at [facility] in [place] 1 week ago for this and underwent some type of imaging study. Was prescribed pantoprazole and Bentyl, but she is having persistent pain. She was told that she did have potential inflammation/infection of her bowel, but she has not been taking any antibiotics.  Past Medical History:  Diagnosis Date  . GERD (gastroesophageal reflux disease)    occasional prilosec  . Hypertension   . Hypothyroidism   . Osteoarthritis   . Varicose vein of leg     Patient Active Problem List   Diagnosis Date Noted  . 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 Edema 01/06/2015  . Hyperglycemia 06/02/2014  . Hypertension   . Varicose vein of leg   . GERD (gastroesophageal reflux disease)   . Osteoarthritis     Past Surgical History:  Procedure Laterality Date  . HIP SURGERY  2008-9   right  . HIP SURGERY   2014   left   . JOINT REPLACEMENT     bil total hips  . THYROIDECTOMY N/A 11/18/2014   Procedure: TOTAL THYROIDECTOMY;  Surgeon: Darnell Level, MD;  Location: [facility] ORS;  Service: General;  Laterality: N/A;    OB History    No data available       Home Medications    Prior to Admission medications   Medication Sig Start Date End Date Taking? Authorizing Provider  calcium-vitamin D (OSCAL WITH D) 500-200 MG-UNIT tablet Take 2 tablets by mouth 3 (three) times daily. 01/20/16   Henrietta Hoover, NP  levothyroxine (SYNTHROID, LEVOTHROID) 100 MCG tablet Take 1 tablet (100 mcg total) by mouth daily. 06/20/16   Henrietta Hoover, NP  naproxen (NAPROSYN) 500 MG tablet Take 1 tablet (500 mg total) by mouth 2 (two) times daily with a meal. 06/19/16   Henrietta Hoover, NP  omeprazole (PRILOSEC OTC) 20 MG tablet Take 1 tablet (20 mg total) by mouth daily. 06/19/16   Henrietta Hoover, NP  oxyCODONE-acetaminophen (PERCOCET/ROXICET) 5-325 MG tablet Take 1 tablet by mouth every 6 (six) hours as needed for severe pain. Patient not taking: Reported on 02/20/2016 11/29/15   Courteney Lyn Mackuen, MD  polyethylene glycol powder (GLYCOLAX/MIRALAX) powder Take 17 g by mouth daily. Dissolve 1 capful powder into any soft food or liquid intake once daily. If no good bowel movement in 3 days, you can take twice daily. 10/10/16   Lavera Guise, MD  ranitidine (ZANTAC) 150 MG tablet Take 1 tablet (150 mg total) by mouth 2 (two) times daily. Patient not taking: Reported on 02/20/2016 06/30/15   Sandford Craze, NP    Family History Family History  Problem Relation Age of Onset  . High blood pressure      Social History Social History  Substance Use Topics  . Smoking status: Never Smoker  . Smokeless tobacco: Never Used  . Alcohol use Yes     Allergies   Patient has  no known allergies.   Review of Systems Review of Systems 10/14 systems reviewed and are negative other than those stated in the HPI   Physical Exam Updated Vital Signs BP 108/58 (BP Location: Left Arm)   Pulse 77   Temp 98.1 F (36.7 C) (Oral)   Resp 18   SpO2 95%   Physical Exam Physical Exam  Nursing note and vitals reviewed. Constitutional: Well developed, well nourished, non-toxic, and in no acute distress Head: Normocephalic and atraumatic.  Mouth/Throat: Oropharynx is clear and moist.  Neck: Normal range of motion. Neck supple.  Cardiovascular: Normal rate and regular rhythm.   Pulmonary/Chest: Effort  normal and breath sounds normal.  Abdominal: Soft. There is LLQ tenderness. There is no rebound and no guarding. no flank pain Musculoskeletal: Normal range of motion.  Neurological: Alert, no facial droop, fluent speech, moves all extremities symmetrically Skin: Skin is warm and dry.  Psychiatric: Cooperative   ED Treatments / Results  Labs (all labs ordered are listed, but only abnormal results are displayed) Labs Reviewed  URINALYSIS, ROUTINE W REFLEX MICROSCOPIC - Abnormal; Notable for the following:       Result Value   APPearance HAZY (*)    Ketones, ur 5 (*)    Protein, ur 30 (*)    Bacteria, UA RARE (*)    Squamous Epithelial / LPF 0-5 (*)    All other components within normal limits  CBC WITH DIFFERENTIAL/PLATELET - Abnormal; Notable for the following:    Hemoglobin 11.8 (*)    All other components within normal limits  COMPREHENSIVE METABOLIC PANEL - Abnormal; Notable for the following:    Sodium 133 (*)    Potassium 3.2 (*)    Chloride 97 (*)    Calcium 7.7 (*)    All other components within normal limits  LIPASE, BLOOD    EKG  EKG Interpretation None       Radiology Ct Abdomen Pelvis W Contrast  Result Date: 10/10/2016 CLINICAL DATA:  Left lower quadrant abdominal pain EXAM: CT ABDOMEN AND PELVIS WITH CONTRAST TECHNIQUE: Multidetector CT imaging of the abdomen and pelvis was performed using the standard protocol following bolus administration of intravenous contrast. CONTRAST:  100mL ISOVUE-300 IOPAMIDOL (ISOVUE-300) INJECTION 61% COMPARISON:  10/10/2016 radiograph, CT abdomen pelvis 02/20/2016 FINDINGS: Lower chest: Lung bases demonstrate no acute consolidation, infiltrate, or pleural effusion. Heart size nonenlarged. Hepatobiliary: Mild fatty infiltration of the liver. Slightly enlarged gallbladder but without calcified stones or wall thickening. No biliary dilatation. Pancreas: Unremarkable. No pancreatic ductal dilatation or surrounding inflammatory changes.  Spleen: Normal in size without focal abnormality. Small accessory splenule Adrenals/Urinary Tract: Adrenal glands are within normal limits. Kidneys show no hydronephrosis or focal calcifications. Bladder largely obscured by streak artifact. Stomach/Bowel: Stomach is nonenlarged. Few air filled loops of small bowel in the left hemiabdomen but without convincing evidence for bowel obstruction. Appendix is visualized and is normal. Colon diverticular disease without acute inflammation. Vascular/Lymphatic: Aortic atherosclerosis. No enlarged abdominal or pelvic lymph nodes. Reproductive: Uterus partially visualized and grossly unremarkable. No gross adnexal masses. Other: No free air or free fluid. Small fatty umbilical hernia. Calcified granuloma in the subcutaneous fat of the gluteal region. Musculoskeletal: Severe multilevel degenerative disc changes. Bilateral hip replacements. IMPRESSION: 1. No definite CT evidence for high-grade mechanical small bowel obstruction. 2. Mild fatty infiltration of the liver Electronically Signed   By: Jasmine PangKim  Fujinaga M.D.   On: 10/10/2016 21:42   Dg Abd 2 Views  Result Date: 10/10/2016 CLINICAL DATA:  Constipation and abdominal distention EXAM: ABDOMEN - 2 VIEW COMPARISON:  CT abdomen pelvis 02/20/2016 FINDINGS: No free intraperitoneal air. There is marked dextroscoliosis of the lumbar spine. There are bilateral total hip arthroplasties without focal hardware abnormality. There is a dilated loop of small bowel within the left lower quadrant. Otherwise, the bowel gas pattern is normal. No fluid levels are seen. IMPRESSION: Dilated loop of small bowel in the left lower quadrant may be secondary to obstruction. No other dilated bowel or fluid levels. No free air. Electronically Signed   By: Deatra RobinsonKevin  Herman M.D.   On: 10/10/2016 20:05    Procedures Procedures (including critical care time)  Medications Ordered in ED Medications  morphine 4 MG/ML injection 4 mg (4 mg Intravenous  Given 10/10/16 1812)  ondansetron (ZOFRAN) injection 4 mg (4 mg Intravenous Given 10/10/16 1812)  iopamidol (ISOVUE-300) 61 % injection 100 mL (100 mLs Intravenous Contrast Given 10/10/16 2116)     Initial Impression / Assessment and Plan / ED Course  I have reviewed the triage vital signs and the nursing notes.  Pertinent labs & imaging results that were available during my care of the patient were reviewed by me and considered in my medical decision making (see chart for details).  Clinical Course     [facility]. She had CT renal stone there that was negative, and blood work, [facility] reassuring.  She is well-appearing today with normal vital signs. Soft and benign abdomen with left lower quadrant tenderness to palpation. Blood work reassuring and urinalysis without blood or infection. She did get x-ray of the abdomen given constipation with abdominal pain and was noted to have dilated loop of bowel in the left lower quadrant that cannot be ruled out for an acute obstruction. She subsequently underwent CT abdomen pelvis, this is visualized showing no evidence of an acute small bowel obstruction. She did have small dilated loop of bowel, but clinically w/o signs of obstruction as eating and drinking normally at home and no vomiting. Passing gas. Complaining of constipation though and will start on bowel regimen. I do think she is stable for discharge home.   Strict return and follow-up instructions reviewed. She expressed understanding of all discharge instructions and felt comfortable with the plan of care.  The patient appears reasonably screened and/or stabilized for discharge and I doubt any other medical condition or other [facility] requiring further screening, evaluation, or treatment in the ED at this time prior to discharge.   Final Clinical Impressions(s) / ED Diagnoses   Final diagnoses:  Constipation  LLQ abdominal pain    New  Prescriptions Discharge Medication List as of 10/10/2016  9:56 PM    START taking these medications   Details  magnesium citrate SOLN Take 296 mLs (1 Bottle total) by mouth once., Starting Wed 10/10/2016, Print    polyethylene glycol powder (GLYCOLAX/MIRALAX) powder Take 17 g by mouth daily. Dissolve 1 capful powder into any soft food or liquid intake once daily. If no good bowel movement in 3 days, you can take twice daily., Starting Wed 10/10/2016, Print         Lavera Guiseana Duo Sadie Hazelett, MD 10/11/16 [phone]

## 2016-10-10 NOTE — Discharge Instructions (Signed)
Please take medications for constipation as prescribed. Your CT scan is reassuring and does not show true blockage. Return without fail for worsening symptoms, including fever, worsening abdominal pain, vomiting, or any other symptoms concerning to you.

## 2016-10-10 NOTE — ED Triage Notes (Signed)
Per pt/son-left flank pain for about 5 days-states urinary frequency-goes a little at a time-no N/V

## 2017-06-30 ENCOUNTER — Encounter (HOSPITAL_COMMUNITY): Payer: Self-pay | Admitting: Emergency Medicine

## 2017-06-30 ENCOUNTER — Emergency Department (HOSPITAL_COMMUNITY)
Admission: EM | Admit: 2017-06-30 | Discharge: 2017-07-01 | Disposition: A | Discharge status: Home / Self Care | Payer: Self-pay | Attending: Emergency Medicine | Admitting: Emergency Medicine

## 2017-06-30 DIAGNOSIS — R1010 Upper abdominal pain, unspecified: Secondary | ICD-10-CM | POA: Insufficient documentation

## 2017-06-30 DIAGNOSIS — E039 Hypothyroidism, unspecified: Secondary | ICD-10-CM | POA: Insufficient documentation

## 2017-06-30 DIAGNOSIS — Z96643 Presence of artificial hip joint, bilateral: Secondary | ICD-10-CM | POA: Insufficient documentation

## 2017-06-30 DIAGNOSIS — Z79899 Other long term (current) drug therapy: Secondary | ICD-10-CM | POA: Insufficient documentation

## 2017-06-30 DIAGNOSIS — I10 Essential (primary) hypertension: Secondary | ICD-10-CM | POA: Insufficient documentation

## 2017-06-30 LAB — COMPREHENSIVE METABOLIC PANEL
ALK PHOS: 50 U/L (ref 38–126)
ALT: 14 U/L (ref 14–54)
ANION GAP: 11 (ref 5–15)
AST: 17 U/L (ref 15–41)
Albumin: 3.7 g/dL (ref 3.5–5.0)
BILIRUBIN TOTAL: 0.5 mg/dL (ref 0.3–1.2)
BUN: 11 mg/dL (ref 6–20)
CALCIUM: 7.6 mg/dL — AB (ref 8.9–10.3)
CO2: 27 mmol/L (ref 22–32)
CREATININE: 0.63 mg/dL (ref 0.44–1.00)
Chloride: 101 mmol/L (ref 101–111)
Glucose, Bld: 104 mg/dL — ABNORMAL HIGH (ref 65–99)
Potassium: 3.3 mmol/L — ABNORMAL LOW (ref 3.5–5.1)
SODIUM: 139 mmol/L (ref 135–145)
TOTAL PROTEIN: 7.1 g/dL (ref 6.5–8.1)

## 2017-06-30 LAB — CBC
HCT: 33.6 % — ABNORMAL LOW (ref 36.0–46.0)
HEMOGLOBIN: 10.8 g/dL — AB (ref 12.0–15.0)
MCH: 24.9 pg — AB (ref 26.0–34.0)
MCHC: 32.1 g/dL (ref 30.0–36.0)
MCV: 77.4 fL — ABNORMAL LOW (ref 78.0–100.0)
PLATELETS: 410 10*3/uL — AB (ref 150–400)
RBC: 4.34 MIL/uL (ref 3.87–5.11)
RDW: 16.6 % — ABNORMAL HIGH (ref 11.5–15.5)
WBC: 7.1 10*3/uL (ref 4.0–10.5)

## 2017-06-30 LAB — LIPASE, BLOOD: Lipase: 19 U/L (ref 11–51)

## 2017-06-30 MED ORDER — ONDANSETRON 4 MG PO TBDP
4.0000 mg | ORAL_TABLET | Freq: Once | ORAL | Status: AC | PRN
  Administered 2017-06-30: 4 mg via ORAL
  Filled 2017-06-30: qty 1

## 2017-06-30 NOTE — ED Triage Notes (Signed)
Pt family reports that pt has been having abd pain and was started on bactrim Friday for UTI. Pt family states that she has been having decreased urine.

## 2017-07-01 ENCOUNTER — Emergency Department (HOSPITAL_COMMUNITY): Payer: Self-pay

## 2017-07-01 LAB — URINALYSIS, ROUTINE W REFLEX MICROSCOPIC
BILIRUBIN URINE: NEGATIVE
GLUCOSE, UA: NEGATIVE mg/dL
HGB URINE DIPSTICK: NEGATIVE
Ketones, ur: NEGATIVE mg/dL
NITRITE: NEGATIVE
PROTEIN: 30 mg/dL — AB
Specific Gravity, Urine: 1.019 (ref 1.005–1.030)
pH: 6 (ref 5.0–8.0)

## 2017-07-01 LAB — TROPONIN I

## 2017-07-01 MED ORDER — ONDANSETRON HCL 4 MG/2ML IJ SOLN
4.0000 mg | Freq: Once | INTRAMUSCULAR | Status: AC
  Administered 2017-07-01: 4 mg via INTRAVENOUS
  Filled 2017-07-01: qty 2

## 2017-07-01 MED ORDER — OMEPRAZOLE 20 MG PO CPDR: 20.0000 mg | DELAYED_RELEASE_CAPSULE | Freq: Every day | ORAL | 0 refills | Status: AC

## 2017-07-01 MED ORDER — MORPHINE SULFATE (PF) 4 MG/ML IV SOLN
4.0000 mg | Freq: Once | INTRAVENOUS | Status: AC
  Administered 2017-07-01: 4 mg via INTRAVENOUS
  Filled 2017-07-01: qty 1

## 2017-07-01 MED ORDER — GI COCKTAIL ~~LOC~~
30.0000 mL | Freq: Once | ORAL | Status: AC
  Administered 2017-07-01: 30 mL via ORAL
  Filled 2017-07-01: qty 30

## 2017-07-01 NOTE — Discharge Instructions (Signed)
Return for any new or worsening symptoms.

## 2017-07-01 NOTE — ED Notes (Addendum)
Pt in xray

## 2017-07-01 NOTE — ED Notes (Signed)
Patient transported to X-ray 

## 2017-07-01 NOTE — ED Provider Notes (Signed)
[facility]-EMERGENCY DEPT Provider Note   CSN: [id number] Arrival date & time: 06/30/17  2058     History   Chief Complaint Chief Complaint  Patient presents with  . Abdominal Pain    HPI Shannon [name] is a 66 y.o. female.  HPI  This is a 66 year old female who presents with abdominal pain. Daughter interprets at the bedside. Patient reports onset of pain 2 days ago.  Reports that she had been seen at urgent care and is being treated for a "infection." She is unsure what kind of infection. She went to urgent care for chills. Since taking Bactrim and prednisone, patient reports increasing upper abdominal pain. She reports nausea. No vomiting or diarrhea. No fevers. Current pain is dull. When asked to rate her pain she states "a little." She does report some worsening of pain with exertion. She denies shortness of breath.  Past Medical History:  Diagnosis Date  . GERD (gastroesophageal reflux disease)    occasional prilosec  . Hypertension   . Hypothyroidism   . Osteoarthritis   . Varicose vein of leg     Patient Active Problem List   Diagnosis Date Noted  . 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 Edema 01/06/2015  . Hyperglycemia 06/02/2014  . Hypertension   . Varicose vein of leg   . GERD (gastroesophageal reflux disease)   . Osteoarthritis     Past Surgical History:  Procedure Laterality Date  . HIP SURGERY  2008-9   right  . HIP SURGERY   2014   left   . JOINT REPLACEMENT     bil total hips  . THYROIDECTOMY N/A 11/18/2014   Procedure: TOTAL THYROIDECTOMY;  Surgeon: Darnell Levelodd Gerkin, MD;  Location: [facility];  Service: General;  Laterality: N/A;    OB History    No data available       Home Medications    Prior to Admission medications   Medication Sig Start Date End Date Taking? Authorizing Provider  calcium-vitamin D (OSCAL WITH D) 500-200 MG-UNIT tablet Take 2 tablets by mouth 3 (three) times daily. 01/20/16   Henrietta HooverBernhardt, Linda C, NP  levothyroxine  (SYNTHROID, LEVOTHROID) 100 MCG tablet Take 1 tablet (100 mcg total) by mouth daily. 06/20/16   Henrietta HooverBernhardt, Linda C, NP  naproxen (NAPROSYN) 500 MG tablet Take 1 tablet (500 mg total) by mouth 2 (two) times daily with a meal. 06/19/16   Henrietta HooverBernhardt, Linda C, NP  omeprazole (PRILOSEC OTC) 20 MG tablet Take 1 tablet (20 mg total) by mouth daily. 06/19/16   Henrietta HooverBernhardt, Linda C, NP  omeprazole (PRILOSEC) 20 MG capsule Take 1 capsule (20 mg total) by mouth daily. 07/01/17   Demetrie Borge, Mayer Maskerourtney F, MD  oxyCODONE-acetaminophen (PERCOCET/ROXICET) 5-325 MG tablet Take 1 tablet by mouth every 6 (six) hours as needed for severe pain. Patient not taking: Reported on 02/20/2016 11/29/15   Mackuen, Courteney Lyn, MD  polyethylene glycol powder (GLYCOLAX/MIRALAX) powder Take 17 g by mouth daily. Dissolve 1 capful powder into any soft food or liquid intake once daily. If no good bowel movement in 3 days, you can take twice daily. 10/10/16   Lavera GuiseLiu, Dana Duo, MD  ranitidine (ZANTAC) 150 MG tablet Take 1 tablet (150 mg total) by mouth 2 (two) times daily. Patient not taking: Reported on 02/20/2016 06/30/15   Sandford Craze'Sullivan, Melissa, NP    Family History Family History  Problem Relation Age of Onset  . High blood pressure Unknown     Social History Social History  Substance Use Topics  .  Smoking status: Never Smoker  . Smokeless tobacco: Never Used  . Alcohol use Yes     Allergies   Patient has no known allergies.   Review of Systems Review of Systems  Constitutional: Negative for fever.  Respiratory: Negative for cough and shortness of breath.   Cardiovascular: Negative for chest pain.  Gastrointestinal: Positive for abdominal pain. Negative for constipation, diarrhea, nausea and vomiting.  Genitourinary: Negative for dysuria and hematuria.  All other systems reviewed and are negative.    Physical Exam Updated Vital Signs BP 122/64 (BP Location: Left Arm)   Pulse 91   Temp 98.1 F (36.7 C) (Oral)   Resp 16   Ht  5\' 5"  (1.651 m)   Wt 115.2 kg (254 lb)   SpO2 96%   BMI 42.27 kg/m   Physical Exam  Constitutional: She is oriented to person, place, and time. She appears well-developed and well-nourished.  Overweight  HENT:  Head: Normocephalic and atraumatic.  Cardiovascular: Normal rate, regular rhythm and normal heart sounds.   No murmur heard. Pulmonary/Chest: Effort normal and breath sounds normal. No respiratory distress. She has no wheezes.  Abdominal: Soft. Bowel sounds are normal. She exhibits no mass. There is tenderness. There is no rebound and no guarding.  Mild tenderness to palpation of the epigastrium, no rebound or guarding  Neurological: She is alert and oriented to person, place, and time.  Skin: Skin is warm and dry.  Psychiatric: She has a normal mood and affect.  Nursing note and vitals reviewed.    ED Treatments / Results  Labs (all labs ordered are listed, but only abnormal results are displayed) Labs Reviewed  COMPREHENSIVE METABOLIC PANEL - Abnormal; Notable for the following:       Result Value   Potassium 3.3 (*)    Glucose, Bld 104 (*)    Calcium 7.6 (*)    All other components within normal limits  CBC - Abnormal; Notable for the following:    Hemoglobin 10.8 (*)    HCT 33.6 (*)    MCV 77.4 (*)    MCH 24.9 (*)    RDW 16.6 (*)    Platelets 410 (*)    All other components within normal limits  URINALYSIS, ROUTINE W REFLEX MICROSCOPIC - Abnormal; Notable for the following:    APPearance HAZY (*)    Protein, ur 30 (*)    Leukocytes, UA MODERATE (*)    Bacteria, UA RARE (*)    Squamous Epithelial / LPF 0-5 (*)    Non Squamous Epithelial 0-5 (*)    All other components within normal limits  LIPASE, BLOOD  TROPONIN I    EKG  EKG Interpretation  Date/Time:  Monday July 01 2017 00:32:44 EDT Ventricular Rate:  93 PR Interval:    QRS Duration: 93 QT Interval:  409 QTC Calculation: 509 R Axis:   -45 Text Interpretation:  Sinus rhythm Probable  left atrial enlargement LAD, consider left anterior fascicular block Anteroseptal infarct, age indeterminate Prolonged QT interval No significant change since last tracing Confirmed by Ross Marcus ([id number]) on 07/01/2017 2:18:16 AM       Radiology Dg Abdomen Acute W/chest  Result Date: 07/01/2017 CLINICAL DATA:  Worsening lower abdominal pain since Friday. Heartburn. EXAM: DG ABDOMEN ACUTE W/ 1V CHEST COMPARISON:  CT abdomen and pelvis from 10/10/2016 FINDINGS: A moderate-sized hiatal hernia is seen projecting over the normal sized cardiac silhouette. Minimal thoracic aortic atherosclerosis without aneurysm. No pneumonic consolidation, CHF, effusion or pneumothorax. Surgical clips  are seen in the expected location of the thyroid. Scoliotic curvature convex to the left at thoracolumbar junction and to the right in the mid lumbar spine. Spondylitic disc space narrowing with vacuum disc noted from L1 through S1. Partially imaged hip arthroplasties are seen. The bowel gas pattern is unremarkable. No free air or suspicious calculi. Phleboliths are present in the lower pelvis. IMPRESSION: Negative abdominal radiographs.  No acute cardiopulmonary disease. Electronically Signed   By: Tollie Eth M.D.   On: 07/01/2017 00:47    Procedures Procedures (including critical care time)  Medications Ordered in ED Medications  ondansetron (ZOFRAN-ODT) disintegrating tablet 4 mg (4 mg Oral Given 06/30/17 2224)  ondansetron (ZOFRAN) injection 4 mg (4 mg Intravenous Given 07/01/17 0048)  morphine 4 MG/ML injection 4 mg (4 mg Intravenous Given 07/01/17 0049)  gi cocktail (Maalox,Lidocaine,Donnatal) (30 mLs Oral Given 07/01/17 0049)     Initial Impression / Assessment and Plan / ED Course  I have reviewed the triage vital signs and the nursing notes.  Pertinent labs & imaging results that were available during my care of the patient were reviewed by me and considered in my medical decision making (see chart for  details).  Clinical Course as of Jul 01 217  Mon Jul 01, 2017  0216 Patient feels much better after GI cocktail. She is able to orally hydrate. Given worsening of symptoms with recent starting of medication, feel she may have a gastritis. We'll start on Protonix.  [CH]    Clinical Course User Index [CH] Dominigue Gellner, Mayer Masker, MD    Patient presents with upper abdominal pain. Onset after starting antibiotics. She is nontoxic on exam. Abdominal exam is fairly benign. She denies any significant other symptoms. Lab work obtained. Acute abdominal series obtained. Patient given Zofran and a GI cocktail. Given that there seems to be an exertional component, screening EKG and troponin obtained. This would be very atypical for ACS. These are reassuring. Lab work is largely unremarkable including lipase. Patient much improved after GI cocktail. Suspect potential gastritis versus peptic ulcer given worsening with ingestion of medication. Will discharge with omeprazole.  After history, exam, and medical workup I feel the patient has been appropriately medically screened and is safe for discharge home. Pertinent diagnoses were discussed with the patient. Patient was given return precautions.   Final Clinical Impressions(s) / ED Diagnoses   Final diagnoses:  Upper abdominal pain    New Prescriptions New Prescriptions   OMEPRAZOLE (PRILOSEC) 20 MG CAPSULE    Take 1 capsule (20 mg total) by mouth daily.     Shon Baton, MD 07/01/17 [phone]

## 2018-07-19 ENCOUNTER — Encounter (HOSPITAL_COMMUNITY): Payer: Self-pay | Admitting: Emergency Medicine

## 2018-07-19 ENCOUNTER — Emergency Department (HOSPITAL_COMMUNITY)
Admission: EM | Admit: 2018-07-19 | Discharge: 2018-07-19 | Disposition: A | Discharge status: Home / Self Care | Payer: Self-pay | Attending: Emergency Medicine | Admitting: Emergency Medicine

## 2018-07-19 DIAGNOSIS — E039 Hypothyroidism, unspecified: Secondary | ICD-10-CM | POA: Insufficient documentation

## 2018-07-19 DIAGNOSIS — N390 Urinary tract infection, site not specified: Secondary | ICD-10-CM | POA: Insufficient documentation

## 2018-07-19 DIAGNOSIS — Z79899 Other long term (current) drug therapy: Secondary | ICD-10-CM | POA: Insufficient documentation

## 2018-07-19 DIAGNOSIS — I10 Essential (primary) hypertension: Secondary | ICD-10-CM | POA: Insufficient documentation

## 2018-07-19 DIAGNOSIS — Z96643 Presence of artificial hip joint, bilateral: Secondary | ICD-10-CM | POA: Insufficient documentation

## 2018-07-19 LAB — URINALYSIS, ROUTINE W REFLEX MICROSCOPIC
Bilirubin Urine: NEGATIVE
GLUCOSE, UA: NEGATIVE mg/dL
Hgb urine dipstick: NEGATIVE
Ketones, ur: NEGATIVE mg/dL
NITRITE: NEGATIVE
PH: 9 — AB (ref 5.0–8.0)
Protein, ur: NEGATIVE mg/dL
Specific Gravity, Urine: 1.005 (ref 1.005–1.030)

## 2018-07-19 LAB — COMPREHENSIVE METABOLIC PANEL
ALT: 12 U/L (ref 0–44)
AST: 17 U/L (ref 15–41)
Albumin: 3.7 g/dL (ref 3.5–5.0)
Alkaline Phosphatase: 57 U/L (ref 38–126)
Anion gap: 12 (ref 5–15)
BILIRUBIN TOTAL: 0.5 mg/dL (ref 0.3–1.2)
BUN: 7 mg/dL — AB (ref 8–23)
CHLORIDE: 98 mmol/L (ref 98–111)
CO2: 28 mmol/L (ref 22–32)
CREATININE: 0.59 mg/dL (ref 0.44–1.00)
Calcium: 8 mg/dL — ABNORMAL LOW (ref 8.9–10.3)
GFR calc Af Amer: >60 mL/min (ref >60)
Glucose, Bld: 103 mg/dL — ABNORMAL HIGH (ref 70–99)
POTASSIUM: 3.5 mmol/L (ref 3.5–5.1)
Sodium: 138 mmol/L (ref 135–145)
TOTAL PROTEIN: 6.7 g/dL (ref 6.5–8.1)

## 2018-07-19 LAB — CBC
HEMATOCRIT: 35.2 % — AB (ref 36.0–46.0)
Hemoglobin: 10.8 g/dL — ABNORMAL LOW (ref 12.0–15.0)
MCH: 23.7 pg — ABNORMAL LOW (ref 26.0–34.0)
MCHC: 30.7 g/dL (ref 30.0–36.0)
MCV: 77.4 fL — AB (ref 78.0–100.0)
PLATELETS: 424 10*3/uL — AB (ref 150–400)
RBC: 4.55 MIL/uL (ref 3.87–5.11)
RDW: 17.2 % — AB (ref 11.5–15.5)
WBC: 7.6 10*3/uL (ref 4.0–10.5)

## 2018-07-19 LAB — LIPASE, BLOOD: LIPASE: 24 U/L (ref 11–51)

## 2018-07-19 MED ORDER — CEPHALEXIN 250 MG PO CAPS
1000.0000 mg | ORAL_CAPSULE | Freq: Once | ORAL | Status: AC
  Administered 2018-07-19: 1000 mg via ORAL
  Filled 2018-07-19: qty 4

## 2018-07-19 MED ORDER — IBUPROFEN 600 MG PO TABS: 600.0000 mg | ORAL_TABLET | Freq: Three times a day (TID) | ORAL | 0 refills | Status: AC | PRN

## 2018-07-19 MED ORDER — CEPHALEXIN 500 MG PO CAPS: 1000.0000 mg | ORAL_CAPSULE | Freq: Two times a day (BID) | ORAL | 0 refills | Status: AC

## 2018-07-19 MED ORDER — IBUPROFEN 800 MG PO TABS
800.0000 mg | ORAL_TABLET | Freq: Once | ORAL | Status: AC
  Administered 2018-07-19: 800 mg via ORAL
  Filled 2018-07-19: qty 1

## 2018-07-19 MED ORDER — PHENAZOPYRIDINE HCL 100 MG PO TABS
200.0000 mg | ORAL_TABLET | Freq: Once | ORAL | Status: AC
  Administered 2018-07-19: 200 mg via ORAL
  Filled 2018-07-19: qty 2

## 2018-07-19 MED ORDER — PHENAZOPYRIDINE HCL 200 MG PO TABS: 200.0000 mg | ORAL_TABLET | Freq: Three times a day (TID) | ORAL | 0 refills | Status: AC | PRN

## 2018-07-19 NOTE — Discharge Instructions (Signed)
1.  Take antibiotics twice a day as prescribed.  Take Pyridium twice a day for burning with urination.  You may take ibuprofen every 8 hours for body aches, back pain or fever. 2.  Return to the emergency department if your symptoms are worsening or other concerning symptoms develop.

## 2018-07-19 NOTE — ED Notes (Signed)
Patient Alert and oriented to baseline. Stable and ambulatory to baseline. Patient verbalized understanding of the discharge instructions.  Patient belongings were taken by the patient.   

## 2018-07-19 NOTE — ED Notes (Signed)
ED Provider at bedside. 

## 2018-07-19 NOTE — ED Triage Notes (Signed)
Pt presents with family who help translate for her; states she is having severe abd pain with chills during urination;

## 2018-07-19 NOTE — ED Provider Notes (Signed)
[facility] EMERGENCY DEPARTMENT Provider Note   CSN: [id number] Arrival date & time: 07/19/18  1930     History   Chief Complaint Chief Complaint  Patient presents with  . Abdominal Pain  . Chills    HPI Shannon [name] is a 67 y.o. female.  HPI Patient reports she has had abdominal pain for about 1 week.  She reports she is had a lot of pain and burning when she urinates.  It starts up about her umbilicus and then hurts down towards the genitals.  She is also had chills.  No nausea or vomiting.  Some pain into the back but not severe. Past Medical History:  Diagnosis Date  . GERD (gastroesophageal reflux disease)    occasional prilosec  . Hypertension   . Hypothyroidism   . Osteoarthritis   . Varicose vein of leg     Patient Active Problem List   Diagnosis Date Noted  . 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 Edema 01/06/2015  . Hyperglycemia 06/02/2014  . Hypertension   . Varicose vein of leg   . GERD (gastroesophageal reflux disease)   . Osteoarthritis     Past Surgical History:  Procedure Laterality Date  . HIP SURGERY  2008-9   right  . HIP SURGERY   2014   left   . JOINT REPLACEMENT     bil total hips  . THYROIDECTOMY N/A 11/18/2014   Procedure: TOTAL THYROIDECTOMY;  Surgeon: Darnell Level, MD;  Location: [facility] OR[facility];  Service: General;  Laterality: N/A;     OB History   None      Home Medications    Prior to Admission medications   Medication Sig Start Date End Date Taking? Authorizing Provider  calcitRIOL (ROCALTROL) 0.25 MCG capsule Take 0.25 mcg by mouth daily.   Yes [provider]  hydrochlorothiazide (MICROZIDE) 12.5 MG capsule Take 12.5 mg by mouth daily.   Yes [provider]  levothyroxine (SYNTHROID, LEVOTHROID) 125 MCG tablet Take 125 mcg by mouth daily before breakfast.   Yes [provider]  omeprazole (PRILOSEC) 20 MG capsule Take 1 capsule (20 mg total) by mouth daily. 07/01/17  Yes  Horton, Mayer Masker, MD  calcium-vitamin D (OSCAL WITH D) 500-200 MG-UNIT tablet Take 2 tablets by mouth 3 (three) times daily. Patient not taking: Reported on 07/19/2018 01/20/16   Henrietta Hoover, NP  cephALEXin (KEFLEX) 500 MG capsule Take 2 capsules (1,000 mg total) by mouth 2 (two) times daily. 07/19/18   Arby Barrette, MD  ibuprofen (ADVIL,MOTRIN) 600 MG tablet Take 1 tablet (600 mg total) by mouth every 8 (eight) hours as needed. 07/19/18   Arby Barrette, MD  levothyroxine (SYNTHROID, LEVOTHROID) 100 MCG tablet Take 1 tablet (100 mcg total) by mouth daily. Patient not taking: Reported on 07/19/2018 06/20/16   Henrietta Hoover, NP  naproxen (NAPROSYN) 500 MG tablet Take 1 tablet (500 mg total) by mouth 2 (two) times daily with a meal. Patient not taking: Reported on 07/19/2018 06/19/16   Henrietta Hoover, NP  omeprazole (PRILOSEC OTC) 20 MG tablet Take 1 tablet (20 mg total) by mouth daily. Patient not taking: Reported on 07/19/2018 06/19/16   Henrietta Hoover, NP  oxyCODONE-acetaminophen (PERCOCET/ROXICET) 5-325 MG tablet Take 1 tablet by mouth every 6 (six) hours as needed for severe pain. Patient not taking: Reported on 02/20/2016 11/29/15   Mackuen, Cindee Salt, MD  phenazopyridine (PYRIDIUM) 200 MG tablet Take 1 tablet (200 mg total) by mouth 3 (three) times  daily as needed for pain. 07/19/18   Arby BarrettePfeiffer, Luis Sami, MD  polyethylene glycol powder (GLYCOLAX/MIRALAX) powder Take 17 g by mouth daily. Dissolve 1 capful powder into any soft food or liquid intake once daily. If no good bowel movement in 3 days, you can take twice daily. Patient not taking: Reported on 07/19/2018 10/10/16   Lavera GuiseLiu, Dana Duo, MD  ranitidine (ZANTAC) 150 MG tablet Take 1 tablet (150 mg total) by mouth 2 (two) times daily. Patient not taking: Reported on 02/20/2016 06/30/15   Sandford Craze'Sullivan, Melissa, NP    Family History Family History  Problem Relation Age of Onset  . High blood pressure Unknown     Social  History Social History   Tobacco Use  . Smoking status: Never Smoker  . Smokeless tobacco: Never Used  Substance Use Topics  . Alcohol use: Yes  . Drug use: No     Allergies   Patient has no known allergies.   Review of Systems Review of Systems 10 Systems reviewed and are negative for acute change except as noted in the HPI.   Physical Exam Updated Vital Signs BP 114/67   Pulse 72   Temp 99.5 F (37.5 C) (Oral)   Resp 19   SpO2 95%   Physical Exam  Constitutional: She is oriented to person, place, and time.  Patient is alert and nontoxic.  No respiratory distress.  Mental status clear.  Morbid obesity.  HENT:  Head: Normocephalic and atraumatic.  Eyes: EOM are normal.  Neck: Neck supple.  Cardiovascular: Normal rate, regular rhythm, normal heart sounds and intact distal pulses.  Pulmonary/Chest: Effort normal and breath sounds normal.  Abdominal:  Abdomen is soft.  Minimal suprapubic tenderness to palpation.  No guarding no rebound.  No CVA tenderness.  Musculoskeletal: Normal range of motion. She exhibits no edema or tenderness.  Neurological: She is alert and oriented to person, place, and time. She exhibits normal muscle tone. Coordination normal.  Skin: Skin is warm and dry.  Psychiatric: She has a normal mood and affect.     ED Treatments / Results  Labs (all labs ordered are listed, but only abnormal results are displayed) Labs Reviewed  COMPREHENSIVE METABOLIC PANEL - Abnormal; Notable for the following components:      Result Value   Glucose, Bld 103 (*)    BUN 7 (*)    Calcium 8.0 (*)    All other components within normal limits  CBC - Abnormal; Notable for the following components:   Hemoglobin 10.8 (*)    HCT 35.2 (*)    MCV 77.4 (*)    MCH 23.7 (*)    RDW 17.2 (*)    Platelets 424 (*)    All other components within normal limits  URINALYSIS, ROUTINE W REFLEX MICROSCOPIC - Abnormal; Notable for the following components:   Color, Urine  STRAW (*)    pH 9.0 (*)    Leukocytes, UA SMALL (*)    Bacteria, UA RARE (*)    All other components within normal limits  LIPASE, BLOOD    EKG None  Radiology No results found.  Procedures Procedures (including critical care time)  Medications Ordered in ED Medications  cephALEXin (KEFLEX) capsule 1,000 mg (1,000 mg Oral Given 07/19/18 2303)  phenazopyridine (PYRIDIUM) tablet 200 mg (200 mg Oral Given 07/19/18 2303)  ibuprofen (ADVIL,MOTRIN) tablet 800 mg (800 mg Oral Given 07/19/18 2303)     Initial Impression / Assessment and Plan / ED Course  I have reviewed the  triage vital signs and the nursing notes.  Pertinent labs & imaging results that were available during my care of the patient were reviewed by me and considered in my medical decision making (see chart for details).      Final Clinical Impressions(s) / ED Diagnoses   Final diagnoses:  Lower urinary tract infectious disease  Patient is clinically well in appearance.  She describes classic UTI symptoms with burning and suprapubic pressure.  She does not have CVA tenderness.  Urinalysis good specimen, positive.  At this time, low suspicion for other etiologies such as appendicitis, cholecystitis or pancreatitis.  Return precautions reviewed.  ED Discharge Orders         Ordered    cephALEXin (KEFLEX) 500 MG capsule  2 times daily     07/19/18 2303    phenazopyridine (PYRIDIUM) 200 MG tablet  3 times daily PRN     07/19/18 2303    ibuprofen (ADVIL,MOTRIN) 600 MG tablet  Every 8 hours PRN     07/19/18 2303           Arby Barrette, MD 07/19/18 2311

## 2020-12-08 ENCOUNTER — Ambulatory Visit: Payer: Self-pay | Admitting: Internal Medicine

## 2022-02-09 DIAGNOSIS — Z20822 Contact with and (suspected) exposure to covid-19: Secondary | ICD-10-CM | POA: Insufficient documentation

## 2022-02-09 DIAGNOSIS — I10 Essential (primary) hypertension: Secondary | ICD-10-CM | POA: Insufficient documentation

## 2022-02-09 DIAGNOSIS — R0602 Shortness of breath: Secondary | ICD-10-CM | POA: Insufficient documentation

## 2022-02-09 DIAGNOSIS — R600 Localized edema: Secondary | ICD-10-CM | POA: Insufficient documentation

## 2022-02-09 DIAGNOSIS — R519 Headache, unspecified: Secondary | ICD-10-CM | POA: Insufficient documentation

## 2022-02-09 DIAGNOSIS — R059 Cough, unspecified: Secondary | ICD-10-CM | POA: Insufficient documentation

## 2022-02-09 DIAGNOSIS — Z79899 Other long term (current) drug therapy: Secondary | ICD-10-CM | POA: Insufficient documentation

## 2022-02-10 ENCOUNTER — Encounter (HOSPITAL_COMMUNITY): Payer: Self-pay

## 2022-02-10 ENCOUNTER — Emergency Department (HOSPITAL_BASED_OUTPATIENT_CLINIC_OR_DEPARTMENT_OTHER): Payer: Self-pay

## 2022-02-10 ENCOUNTER — Emergency Department (HOSPITAL_COMMUNITY)
Admission: EM | Admit: 2022-02-10 | Discharge: 2022-02-10 | Disposition: A | Discharge status: Home / Self Care | Payer: Self-pay | Attending: Emergency Medicine | Admitting: Emergency Medicine

## 2022-02-10 ENCOUNTER — Other Ambulatory Visit: Payer: Self-pay

## 2022-02-10 ENCOUNTER — Emergency Department (HOSPITAL_COMMUNITY): Payer: Self-pay

## 2022-02-10 DIAGNOSIS — M7989 Other specified soft tissue disorders: Secondary | ICD-10-CM

## 2022-02-10 DIAGNOSIS — R0602 Shortness of breath: Secondary | ICD-10-CM

## 2022-02-10 LAB — CBC WITH DIFFERENTIAL/PLATELET
Abs Immature Granulocytes: 0.02 10*3/uL (ref 0.00–0.07)
Basophils Absolute: 0.1 10*3/uL (ref 0.0–0.1)
Basophils Relative: 1 %
Eosinophils Absolute: 0.2 10*3/uL (ref 0.0–0.5)
Eosinophils Relative: 3 %
HCT: 29.9 % — ABNORMAL LOW (ref 36.0–46.0)
Hemoglobin: 8.7 g/dL — ABNORMAL LOW (ref 12.0–15.0)
Immature Granulocytes: 0 %
Lymphocytes Relative: 35 %
Lymphs Abs: 2.2 10*3/uL (ref 0.7–4.0)
MCH: 21.9 pg — ABNORMAL LOW (ref 26.0–34.0)
MCHC: 29.1 g/dL — ABNORMAL LOW (ref 30.0–36.0)
MCV: 75.3 fL — ABNORMAL LOW (ref 80.0–100.0)
Monocytes Absolute: 0.7 10*3/uL (ref 0.1–1.0)
Monocytes Relative: 10 %
Neutro Abs: 3.3 10*3/uL (ref 1.7–7.7)
Neutrophils Relative %: 51 %
Platelets: 525 10*3/uL — ABNORMAL HIGH (ref 150–400)
RBC: 3.97 MIL/uL (ref 3.87–5.11)
RDW: 21.4 % — ABNORMAL HIGH (ref 11.5–15.5)
WBC: 6.4 10*3/uL (ref 4.0–10.5)
nRBC: 0 % (ref 0.0–0.2)

## 2022-02-10 LAB — COMPREHENSIVE METABOLIC PANEL
ALT: 19 U/L (ref 0–44)
AST: 22 U/L (ref 15–41)
Albumin: 3.7 g/dL (ref 3.5–5.0)
Alkaline Phosphatase: 43 U/L (ref 38–126)
Anion gap: 10 (ref 5–15)
BUN: 12 mg/dL (ref 8–23)
CO2: 31 mmol/L (ref 22–32)
Calcium: 8.8 mg/dL — ABNORMAL LOW (ref 8.9–10.3)
Chloride: 93 mmol/L — ABNORMAL LOW (ref 98–111)
Creatinine, Ser: 0.54 mg/dL (ref 0.44–1.00)
GFR, Estimated: >60 mL/min (ref >60)
Glucose, Bld: 109 mg/dL — ABNORMAL HIGH (ref 70–99)
Potassium: 3.3 mmol/L — ABNORMAL LOW (ref 3.5–5.1)
Sodium: 134 mmol/L — ABNORMAL LOW (ref 135–145)
Total Bilirubin: 0.6 mg/dL (ref 0.3–1.2)
Total Protein: 6.8 g/dL (ref 6.5–8.1)

## 2022-02-10 LAB — RESP PANEL BY RT-PCR (FLU A&B, COVID) ARPGX2
Influenza A by PCR: NEGATIVE
Influenza B by PCR: NEGATIVE
SARS Coronavirus 2 by RT PCR: NEGATIVE

## 2022-02-10 LAB — BRAIN NATRIURETIC PEPTIDE: B Natriuretic Peptide: 22.7 pg/mL (ref 0.0–100.0)

## 2022-02-10 LAB — TROPONIN I (HIGH SENSITIVITY): Troponin I (High Sensitivity): <2 ng/L (ref <18)

## 2022-02-10 MED ORDER — IOHEXOL 350 MG/ML SOLN
100.0000 mL | Freq: Once | INTRAVENOUS | Status: AC | PRN
  Administered 2022-02-10: 75 mL via INTRAVENOUS

## 2022-02-10 MED ORDER — SODIUM CHLORIDE (PF) 0.9 % IJ SOLN
INTRAMUSCULAR | Status: AC
  Filled 2022-02-10: qty 50

## 2022-02-10 NOTE — Discharge Instructions (Signed)
Return for any problem.  ?

## 2022-02-10 NOTE — Progress Notes (Signed)
Bilateral lower extremity venous duplex has been completed. ?Preliminary results can be found in CV Proc through chart review.  ?Results were given to Dr. Rodena Medin. ? ?02/10/22 10:18 AM ?Olen Cordial RVT   ?

## 2022-02-10 NOTE — ED Triage Notes (Signed)
Pt presents to ED from home with c/o SOB and heacahe x 1 week. Pt states she was seen by PCP and told she has low iron. Pt prescribed albuterol but states SOB has worsened. ?

## 2022-02-10 NOTE — ED Notes (Signed)
Patient has a extra blue top in the main lab 

## 2022-02-10 NOTE — ED Provider Notes (Signed)
?[facility]-EMERGENCY DEPT ?Provider Note ? ? ?CSN: [id number] ?Arrival date & time: 02/09/22  2359 ? ?  ? ?History ? ?Chief Complaint  ?Patient presents with  ? Shortness of Breath  ? Headache  ? ? ?Shannon [name] is a 71 y.o. female. ? ?71 year old female with prior medical history as detailed below presents for evaluation.  Patient with multiple complaints.  Patient reports mild cough.  Patient reports mild dyspnea.  Patient reports a mild headache.  Symptoms of been ongoing for at least the last 1 to 2 weeks. ? ?Patient denies chest pain.  She denies fever. ? ?Patient reports that she was seen in the clinic recently and started on iron to treat iron deficiency anemia. ? ?She denies focal weakness. ? ?The history is provided by the patient and medical records. A language interpreter was used.  ?Illness ?Location:  Headache, shortness of breath, cough ?Severity:  Mild ?Onset quality:  Gradual ?Duration:  4 weeks ?Timing:  Constant ?Progression:  Waxing and waning ?Chronicity:  Chronic ? ?  ? ?Home Medications ?Prior to Admission medications   ?Medication Sig Start Date End Date Taking? Authorizing Provider  ?calcitRIOL (ROCALTROL) 0.25 MCG capsule Take 0.25 mcg by mouth daily.    [provider]  ?calcium-vitamin D (OSCAL WITH D) 500-200 MG-UNIT tablet Take 2 tablets by mouth 3 (three) times daily. ?Patient not taking: Reported on 07/19/2018 01/20/16   Henrietta Hoover, NP  ?cephALEXin (KEFLEX) 500 MG capsule Take 2 capsules (1,000 mg total) by mouth 2 (two) times daily. 07/19/18   Arby Barrette, MD  ?hydrochlorothiazide (MICROZIDE) 12.5 MG capsule Take 12.5 mg by mouth daily.    [provider]  ?ibuprofen (ADVIL,MOTRIN) 600 MG tablet Take 1 tablet (600 mg total) by mouth every 8 (eight) hours as needed. 07/19/18   Arby Barrette, MD  ?levothyroxine (SYNTHROID, LEVOTHROID) 100 MCG tablet Take 1 tablet (100 mcg total) by mouth daily. ?Patient not taking: Reported on  07/19/2018 06/20/16   Henrietta Hoover, NP  ?levothyroxine (SYNTHROID, LEVOTHROID) 125 MCG tablet Take 125 mcg by mouth daily before breakfast.    [provider]  ?naproxen (NAPROSYN) 500 MG tablet Take 1 tablet (500 mg total) by mouth 2 (two) times daily with a meal. ?Patient not taking: Reported on 07/19/2018 06/19/16   Henrietta Hoover, NP  ?omeprazole (PRILOSEC OTC) 20 MG tablet Take 1 tablet (20 mg total) by mouth daily. ?Patient not taking: Reported on 07/19/2018 06/19/16   Henrietta Hoover, NP  ?omeprazole (PRILOSEC) 20 MG capsule Take 1 capsule (20 mg total) by mouth daily. 07/01/17   Horton, Mayer Masker, MD  ?oxyCODONE-acetaminophen (PERCOCET/ROXICET) 5-325 MG tablet Take 1 tablet by mouth every 6 (six) hours as needed for severe pain. ?Patient not taking: Reported on 02/20/2016 11/29/15   Mackuen, Cindee Salt, MD  ?phenazopyridine (PYRIDIUM) 200 MG tablet Take 1 tablet (200 mg total) by mouth 3 (three) times daily as needed for pain. 07/19/18   Arby Barrette, MD  ?polyethylene glycol powder (GLYCOLAX/MIRALAX) powder Take 17 g by mouth daily. Dissolve 1 capful powder into any soft food or liquid intake once daily. If no good bowel movement in 3 days, you can take twice daily. ?Patient not taking: Reported on 07/19/2018 10/10/16   Lavera Guise, MD  ?ranitidine (ZANTAC) 150 MG tablet Take 1 tablet (150 mg total) by mouth 2 (two) times daily. ?Patient not taking: Reported on 02/20/2016 06/30/15   Sandford Craze, NP  ?   ? ?Allergies    ?  Patient has no known allergies.   ? ?Review of Systems   ?Review of Systems  ?All other systems reviewed and are negative. ? ?Physical Exam ?Updated Vital Signs ?BP 114/69   Pulse 84   Temp (!) 97.4 ?F (36.3 ?C) (Oral)   Resp 15   SpO2 96%  ?Physical Exam ?Vitals and nursing note reviewed.  ?Constitutional:   ?   General: She is not in acute distress. ?   Appearance: Normal appearance. She is well-developed.  ?HENT:  ?   Head: Normocephalic and atraumatic.  ?Eyes:   ?   Conjunctiva/sclera: Conjunctivae normal.  ?   Pupils: Pupils are equal, round, and reactive to light.  ?Cardiovascular:  ?   Rate and Rhythm: Normal rate and regular rhythm.  ?   Heart sounds: Normal heart sounds.  ?Pulmonary:  ?   Effort: Pulmonary effort is normal. No respiratory distress.  ?   Breath sounds: Normal breath sounds.  ?Abdominal:  ?   General: There is no distension.  ?   Palpations: Abdomen is soft.  ?   Tenderness: There is no abdominal tenderness.  ?Musculoskeletal:     ?   General: No deformity. Normal range of motion.  ?   Cervical back: Normal range of motion and neck supple.  ?   Right lower leg: Edema present.  ?   Left lower leg: Edema present.  ?   Comments: Mild 1+ pitting edema to the anterior tibial areas of both lower extremities.  ?Skin: ?   General: Skin is warm and dry.  ?Neurological:  ?   General: No focal deficit present.  ?   Mental Status: She is alert and oriented to person, place, and time.  ? ? ?ED Results / Procedures / Treatments   ?Labs ?(all labs ordered are listed, but only abnormal results are displayed) ?Labs Reviewed  ?CBC WITH DIFFERENTIAL/PLATELET - Abnormal; Notable for the following components:  ?    Result Value  ? Hemoglobin 8.7 (*)   ? HCT 29.9 (*)   ? MCV 75.3 (*)   ? MCH 21.9 (*)   ? MCHC 29.1 (*)   ? RDW 21.4 (*)   ? Platelets 525 (*)   ? All other components within normal limits  ?COMPREHENSIVE METABOLIC PANEL - Abnormal; Notable for the following components:  ? Sodium 134 (*)   ? Potassium 3.3 (*)   ? Chloride 93 (*)   ? Glucose, Bld 109 (*)   ? Calcium 8.8 (*)   ? All other components within normal limits  ?RESP PANEL BY RT-PCR (FLU A&B, COVID) ARPGX2  ?BRAIN NATRIURETIC PEPTIDE  ?TROPONIN I (HIGH SENSITIVITY)  ? ? ?EKG ?EKG Interpretation ? ?Date/Time:  Saturday February 10 2022 07:31:45 EDT ?Ventricular Rate:  78 ?PR Interval:  186 ?QRS Duration: 88 ?QT Interval:  488 ?QTC Calculation: 556 ?R Axis:   -57 ?Text Interpretation: Sinus rhythm Left axis  deviation Anteroseptal infarct, age indeterminate Prolonged QT interval Confirmed by Kristine RoyalMessick, Chayce Rullo [phone]) on 02/10/2022 7:53:36 AM ? ?Radiology ?DG Chest 2 View ? ?Result Date: 02/10/2022 ?CLINICAL DATA:  71 year old female with chest pain and shortness of breath. EXAM: CHEST - 2 VIEW COMPARISON:  Chest and abdominal radiographs 07/01/2017 and earlier. FINDINGS: Upright AP and lateral views of the chest. Suggestion of cardiomegaly with evidence of right heart enlargement on the lateral view appears alternatively related to a moderate size gastric hiatal hernia, demonstrated by CT in 2017. Some chronic tortuosity of the thoracic aorta is stable.  Other mediastinal contours are within normal limits. Evidence of chronic thyroidectomy. Visualized tracheal air column is within normal limits. Lower lung volumes from priors. Chronic increased pulmonary interstitial markings are mildly increased. No pneumothorax, pleural effusion or consolidation. No acute osseous abnormality identified. Negative visible bowel gas. IMPRESSION: 1. Lower lung volumes. Moderate chronic hiatal hernia simulating the appearance of right heart enlargement. 2. Mildly increased interstitial markings are nonspecific. Atelectasis is favored over either interstitial edema or viral/atypical respiratory infection. Electronically Signed   By: Odessa Fleming M.D.   On: 02/10/2022 06:13  ? ?CT Angio Chest PE W and/or Wo Contrast ? ?Result Date: 02/10/2022 ?CLINICAL DATA:  71 year old female with history of elevated D-dimer. Shortness of breath. Headache for 1 week. Evaluate for pulmonary embolism. EXAM: CT ANGIOGRAPHY CHEST WITH CONTRAST TECHNIQUE: Multidetector CT imaging of the chest was performed using the standard protocol during bolus administration of intravenous contrast. Multiplanar CT image reconstructions and MIPs were obtained to evaluate the vascular anatomy. RADIATION DOSE REDUCTION: This exam was performed according to the departmental  dose-optimization program which includes automated exposure control, adjustment of the mA and/or kV according to patient size and/or use of iterative reconstruction technique. CONTRAST:  57mL OMNIPAQUE IOHEXOL 350 MG/ML SOL

## 2023-03-13 ENCOUNTER — Inpatient Hospital Stay: Payer: Self-pay

## 2023-07-17 ENCOUNTER — Other Ambulatory Visit: Payer: Self-pay

## 2023-07-17 ENCOUNTER — Emergency Department (HOSPITAL_COMMUNITY)
Admission: EM | Admit: 2023-07-17 | Discharge: 2023-07-17 | Disposition: A | Discharge status: Home / Self Care | Payer: Self-pay | Attending: Emergency Medicine | Admitting: Emergency Medicine

## 2023-07-17 DIAGNOSIS — Z23 Encounter for immunization: Secondary | ICD-10-CM | POA: Insufficient documentation

## 2023-07-17 DIAGNOSIS — Z96643 Presence of artificial hip joint, bilateral: Secondary | ICD-10-CM | POA: Insufficient documentation

## 2023-07-17 DIAGNOSIS — S61211A Laceration without foreign body of left index finger without damage to nail, initial encounter: Secondary | ICD-10-CM | POA: Insufficient documentation

## 2023-07-17 DIAGNOSIS — W260XXA Contact with knife, initial encounter: Secondary | ICD-10-CM | POA: Insufficient documentation

## 2023-07-17 DIAGNOSIS — Z79899 Other long term (current) drug therapy: Secondary | ICD-10-CM | POA: Insufficient documentation

## 2023-07-17 DIAGNOSIS — E039 Hypothyroidism, unspecified: Secondary | ICD-10-CM | POA: Insufficient documentation

## 2023-07-17 DIAGNOSIS — I10 Essential (primary) hypertension: Secondary | ICD-10-CM | POA: Insufficient documentation

## 2023-07-17 DIAGNOSIS — Y93G3 Activity, cooking and baking: Secondary | ICD-10-CM | POA: Insufficient documentation

## 2023-07-17 MED ORDER — TETANUS-DIPHTH-ACELL PERTUSSIS 5-2.5-18.5 LF-MCG/0.5 IM SUSY
0.5000 mL | PREFILLED_SYRINGE | Freq: Once | INTRAMUSCULAR | Status: AC
Start: 2023-07-17 — End: 2023-07-17
  Administered 2023-07-17: 0.5 mL via INTRAMUSCULAR
  Filled 2023-07-17: qty 0.5

## 2023-07-17 MED ORDER — LIDOCAINE HCL (PF) 1 % IJ SOLN
10.0000 mL | Freq: Once | INTRAMUSCULAR | Status: AC
  Administered 2023-07-17: 10 mL
  Filled 2023-07-17: qty 10

## 2023-07-17 NOTE — ED Triage Notes (Signed)
Pt was cooking last night and cut her pointer finger on left hand with knife. Bleeding was controlled but this morning pt had increased pain and some bleeding. Finger is bandaged and the bleeding is controlled. Able to move finger

## 2023-07-17 NOTE — Discharge Instructions (Addendum)
Mantenga su herida seca y limpia durante las prximas 24 a 48 horas.  Regrese a un centro de atencin de [place] en aproximadamente 10 das para retirar la sutura y Programme researcher, broadcasting/film/video a Public relations account executive herida. Tome Tylenol/ibuprofeno/naproxeno para Chief Technology Officer. No dude en regresar al departamento de emergencias si los sntomas preocupantes que discutimos se vuelven evidentes.  Please keep your wound dry and clean in the next 24 to 48 hours.  Please return to an urgent care in about 10 days for suture removal and wound recheck. Please take tylenol/ibuprofen/naproxen for pain. I recommend close follow-up with PCP for reevaluation.  Please do not hesitate to return to emergency department if worrisome signs symptoms we discussed become apparent.

## 2023-07-17 NOTE — ED Notes (Signed)
Patient verbalizes understanding of discharge instructions. Opportunity for questioning and answers were provided. Armband removed by staff, pt discharged from ED. Pt taken to ED waiting room via wheel chair.

## 2023-07-17 NOTE — ED Provider Notes (Signed)
[place] EMERGENCY DEPARTMENT AT [facility] Provider Note   CSN: [id number] Arrival date & time: 07/17/23  1139     History {Add pertinent medical, surgical, social history, OB history to HPI:1} Chief Complaint  Patient presents with   Laceration    Left hand pointer finger    Shannon [name] is a 72 y.o. female with a past medical history of hypertension, hypothyroidism presented today for evaluation of finger laceration.  Patient was cooking last night and cut her pointer finger with a knife.  Bleeding was controlled but this morning patient had increased pain and some bleeding.  Patient was not sure when her last shot was.  She denies any fever or loss of send she denies any loss of sensations on her left hand.   Laceration   Past Medical History:  Diagnosis Date   GERD (gastroesophageal reflux disease)    occasional prilosec   Hypertension    Hypothyroidism    Osteoarthritis    Varicose vein of leg    Past Surgical History:  Procedure Laterality Date   HIP SURGERY  2008-9   right   HIP SURGERY   2014   left    JOINT REPLACEMENT     bil total hips   THYROIDECTOMY N/A 11/18/2014   Procedure: TOTAL THYROIDECTOMY;  Surgeon: Darnell Level, MD;  Location: [facility] ORS;  Service: General;  Laterality: N/A;     Home Medications Prior to Admission medications   Medication Sig Start Date End Date Taking? Authorizing Provider  calcitRIOL (ROCALTROL) 0.25 MCG capsule Take 0.25 mcg by mouth daily.    [provider]  calcium-vitamin D (OSCAL WITH D) 500-200 MG-UNIT tablet Take 2 tablets by mouth 3 (three) times daily. Patient not taking: Reported on 07/19/2018 01/20/16   Henrietta Hoover, NP  cephALEXin (KEFLEX) 500 MG capsule Take 2 capsules (1,000 mg total) by mouth 2 (two) times daily. 07/19/18   Arby Barrette, MD  hydrochlorothiazide (MICROZIDE) 12.5 MG capsule Take 12.5 mg by mouth daily.    [provider]  ibuprofen (ADVIL,MOTRIN) 600 MG  tablet Take 1 tablet (600 mg total) by mouth every 8 (eight) hours as needed. 07/19/18   Arby Barrette, MD  levothyroxine (SYNTHROID, LEVOTHROID) 100 MCG tablet Take 1 tablet (100 mcg total) by mouth daily. Patient not taking: Reported on 07/19/2018 06/20/16   Henrietta Hoover, NP  levothyroxine (SYNTHROID, LEVOTHROID) 125 MCG tablet Take 125 mcg by mouth daily before breakfast.    [provider]  naproxen (NAPROSYN) 500 MG tablet Take 1 tablet (500 mg total) by mouth 2 (two) times daily with a meal. Patient not taking: Reported on 07/19/2018 06/19/16   Henrietta Hoover, NP  omeprazole (PRILOSEC OTC) 20 MG tablet Take 1 tablet (20 mg total) by mouth daily. Patient not taking: Reported on 07/19/2018 06/19/16   Henrietta Hoover, NP  omeprazole (PRILOSEC) 20 MG capsule Take 1 capsule (20 mg total) by mouth daily. 07/01/17   Horton, Mayer Masker, MD  oxyCODONE-acetaminophen (PERCOCET/ROXICET) 5-325 MG tablet Take 1 tablet by mouth every 6 (six) hours as needed for severe pain. Patient not taking: Reported on 02/20/2016 11/29/15   Mackuen, Cindee Salt, MD  phenazopyridine (PYRIDIUM) 200 MG tablet Take 1 tablet (200 mg total) by mouth 3 (three) times daily as needed for pain. 07/19/18   Arby Barrette, MD  polyethylene glycol powder (GLYCOLAX/MIRALAX) powder Take 17 g by mouth daily. Dissolve 1 capful powder into any soft food or liquid intake once daily.  If no good bowel movement in 3 days, you can take twice daily. Patient not taking: Reported on 07/19/2018 10/10/16   Lavera Guise, MD  ranitidine (ZANTAC) 150 MG tablet Take 1 tablet (150 mg total) by mouth 2 (two) times daily. Patient not taking: Reported on 02/20/2016 06/30/15   Sandford Craze, NP      Allergies    Patient has no known allergies.    Review of Systems   Review of Systems Negative except as per HPI.  Physical Exam Updated Vital Signs BP (!) 150/74   Pulse 61   Temp 98.2 F (36.8 C) (Oral)   Resp 18   Ht 5\' 5"   (1.651 m)   Wt 115 kg   SpO2 95%   BMI 42.19 kg/m  Physical Exam Vitals and nursing note reviewed.  Constitutional:      Appearance: Normal appearance.  HENT:     Head: Normocephalic and atraumatic.     Mouth/Throat:     Mouth: Mucous membranes are moist.  Eyes:     General: No scleral icterus. Cardiovascular:     Rate and Rhythm: Normal rate and regular rhythm.     Pulses: Normal pulses.     Heart sounds: Normal heart sounds.  Pulmonary:     Effort: Pulmonary effort is normal.     Breath sounds: Normal breath sounds.  Abdominal:     General: Abdomen is flat.     Palpations: Abdomen is soft.     Tenderness: There is no abdominal tenderness.  Musculoskeletal:        General: No deformity.  Skin:    General: Skin is warm.     Findings: No rash.     Comments: 2 cm laceration on the dorsal aspect of the left pointer finger.  Bleeding is controlled.  Neurological:     General: No focal deficit present.     Mental Status: She is alert.  Psychiatric:        Mood and Affect: Mood normal.     ED Results / Procedures / Treatments   Labs (all labs ordered are listed, but only abnormal results are displayed) Labs Reviewed - No data to display  EKG None  Radiology No results found.  Procedures Procedures  {Document cardiac monitor, telemetry assessment procedure when appropriate:1}  Medications Ordered in ED Medications  Tdap (BOOSTRIX) injection 0.5 mL (has no administration in time range)  lidocaine (PF) (XYLOCAINE) 1 % injection 10 mL (has no administration in time range)    ED Course/ Medical Decision Making/ A&P   {   Click here for ABCD2, HEART and other calculatorsREFRESH Note before signing :1}                              Medical Decision Making Risk Prescription drug management.  72 year old female presents for evaluation of finger laceration.  There is a 2 cm L-shaped laceration on the dorsal aspect of the left index finger.  Bleeding controlled.   Wound inspected under direct bright light with good visualization. Area with linear laceration across soft tissue through adipose without exposure of muscle belly or tendon. No overt foreign body. Area hemostatic. Neurovascular exam congruent with above. Area extensively irrigated with sterile normal saline under pressure. Laceration repaired in simple fashion as above (please see procedure note for further details). Patient tolerated procedure well and neurovascular exam intact and unchanged post repair with intact distal pulses and cap refill.  Cautious return precautions discussed w/ full understanding. Wound care discussed. Prompt follow up with primary care physician discussed and return for suture removal in 10 days.  {Document critical care time when appropriate:1} {Document review of labs and clinical decision tools ie heart score, Chads2Vasc2 etc:1}  {Document your independent review of radiology images, and any outside records:1} {Document your discussion with family members, caretakers, and with consultants:1} {Document social determinants of health affecting pt's care:1} {Document your decision making why or why not admission, treatments were needed:1} Final Clinical Impression(s) / ED Diagnoses Final diagnoses:  None    Rx / DC Orders ED Discharge Orders     None

## 2023-07-17 NOTE — ED Provider Triage Note (Signed)
Emergency Medicine Provider Triage Evaluation Note  Shannon [name] , a 72 y.o. female  was evaluated in triage.  Pt complains of laceration to dorsum of left index finger.  This occurred while she was cutting food at approximately 9 PM last night.  Bleeding was controlled but came back this morning.  She reports some pain.  No difficulty moving the extremity.  No numbness or tingling.  Review of Systems  Positive: As above Negative: As above  Physical Exam  BP (!) 155/74 (BP Location: Right Arm)   Pulse 77   Temp 98.2 F (36.8 C) (Oral)   Resp 18   Ht 5\' 5"  (1.651 m)   Wt 115 kg   SpO2 97%   BMI 42.19 kg/m  Gen:   Awake, no distress   Resp:  Normal effort  MSK:   Moves extremities without difficulty  Other:  3 cm laceration to the dorsum of the left index finger.  Full AROM in flexion extension of the digit.  Capillary refill normal.  Sensation intact distally.  No bleeding.  Medical Decision Making  Medically screening exam initiated at 12:46 PM.  Appropriate orders placed.  [name] was informed that the remainder of the evaluation will be completed by another provider, this initial triage assessment does not replace that evaluation, and the importance of remaining in the ED until their evaluation is complete.     Michelle Piper, [place] 07/17/23 1247
